# Patient Record
Sex: Male | Born: 1985 | Race: White | Hispanic: No | Marital: Married | Smoking: Never smoker
Health system: Southern US, Community
[De-identification: ages and names within clinical notes are randomized; demographics above are authoritative.]

## PROBLEM LIST (undated history)

## (undated) DIAGNOSIS — C801 Malignant (primary) neoplasm, unspecified: Secondary | ICD-10-CM

## (undated) DIAGNOSIS — I10 Essential (primary) hypertension: Secondary | ICD-10-CM

## (undated) DIAGNOSIS — C6290 Malignant neoplasm of unspecified testis, unspecified whether descended or undescended: Secondary | ICD-10-CM

## (undated) HISTORY — PX: ANTERIOR CRUCIATE LIGAMENT REPAIR: SHX115

## (undated) HISTORY — PX: ORCHIECTOMY: SHX2116

---

## 2019-05-15 DIAGNOSIS — S61401A Unspecified open wound of right hand, initial encounter: Secondary | ICD-10-CM | POA: Diagnosis not present

## 2019-11-18 DIAGNOSIS — Z20822 Contact with and (suspected) exposure to covid-19: Secondary | ICD-10-CM | POA: Diagnosis not present

## 2019-11-18 DIAGNOSIS — R05 Cough: Secondary | ICD-10-CM | POA: Diagnosis not present

## 2019-11-18 DIAGNOSIS — R110 Nausea: Secondary | ICD-10-CM | POA: Diagnosis not present

## 2019-12-02 DIAGNOSIS — H6692 Otitis media, unspecified, left ear: Secondary | ICD-10-CM | POA: Diagnosis not present

## 2019-12-02 DIAGNOSIS — R030 Elevated blood-pressure reading, without diagnosis of hypertension: Secondary | ICD-10-CM | POA: Diagnosis not present

## 2019-12-02 DIAGNOSIS — H6980 Other specified disorders of Eustachian tube, unspecified ear: Secondary | ICD-10-CM | POA: Diagnosis not present

## 2020-01-27 DIAGNOSIS — R110 Nausea: Secondary | ICD-10-CM | POA: Diagnosis not present

## 2020-01-27 DIAGNOSIS — B349 Viral infection, unspecified: Secondary | ICD-10-CM | POA: Diagnosis not present

## 2020-01-28 DIAGNOSIS — F4323 Adjustment disorder with mixed anxiety and depressed mood: Secondary | ICD-10-CM | POA: Diagnosis not present

## 2020-02-23 DIAGNOSIS — F4323 Adjustment disorder with mixed anxiety and depressed mood: Secondary | ICD-10-CM | POA: Diagnosis not present

## 2020-03-10 DIAGNOSIS — R059 Cough, unspecified: Secondary | ICD-10-CM | POA: Diagnosis not present

## 2020-03-10 DIAGNOSIS — Z20822 Contact with and (suspected) exposure to covid-19: Secondary | ICD-10-CM | POA: Diagnosis not present

## 2020-03-24 DIAGNOSIS — F4323 Adjustment disorder with mixed anxiety and depressed mood: Secondary | ICD-10-CM | POA: Diagnosis not present

## 2020-05-06 ENCOUNTER — Encounter (HOSPITAL_COMMUNITY): Payer: Self-pay

## 2020-05-06 ENCOUNTER — Emergency Department (HOSPITAL_COMMUNITY)
Admission: EM | Admit: 2020-05-06 | Discharge: 2020-05-06 | Disposition: A | Discharge status: Home / Self Care | Payer: BC Managed Care – PPO | Attending: Emergency Medicine | Admitting: Emergency Medicine

## 2020-05-06 ENCOUNTER — Other Ambulatory Visit: Payer: Self-pay

## 2020-05-06 DIAGNOSIS — R5383 Other fatigue: Secondary | ICD-10-CM | POA: Insufficient documentation

## 2020-05-06 DIAGNOSIS — Z5321 Procedure and treatment not carried out due to patient leaving prior to being seen by health care provider: Secondary | ICD-10-CM | POA: Insufficient documentation

## 2020-05-06 DIAGNOSIS — R112 Nausea with vomiting, unspecified: Secondary | ICD-10-CM | POA: Insufficient documentation

## 2020-05-06 HISTORY — DX: Malignant (primary) neoplasm, unspecified: C80.1

## 2020-05-06 HISTORY — DX: Malignant neoplasm of unspecified testis, unspecified whether descended or undescended: C62.90

## 2020-05-06 LAB — CBC
HCT: 47.8 % (ref 39.0–52.0)
Hemoglobin: 17.1 g/dL — ABNORMAL HIGH (ref 13.0–17.0)
MCH: 33.7 pg (ref 26.0–34.0)
MCHC: 35.8 g/dL (ref 30.0–36.0)
MCV: 94.1 fL (ref 80.0–100.0)
Platelets: 221 10*3/uL (ref 150–400)
RBC: 5.08 MIL/uL (ref 4.22–5.81)
RDW: 11.5 % (ref 11.5–15.5)
WBC: 6.6 10*3/uL (ref 4.0–10.5)
nRBC: 0 % (ref 0.0–0.2)

## 2020-05-06 LAB — COMPREHENSIVE METABOLIC PANEL
ALT: 235 U/L — ABNORMAL HIGH (ref 0–44)
AST: 700 U/L — ABNORMAL HIGH (ref 15–41)
Albumin: 4 g/dL (ref 3.5–5.0)
Alkaline Phosphatase: 126 U/L (ref 38–126)
Anion gap: 18 — ABNORMAL HIGH (ref 5–15)
BUN: 17 mg/dL (ref 6–20)
CO2: 22 mmol/L (ref 22–32)
Calcium: 8.4 mg/dL — ABNORMAL LOW (ref 8.9–10.3)
Chloride: 94 mmol/L — ABNORMAL LOW (ref 98–111)
Creatinine, Ser: 0.9 mg/dL (ref 0.61–1.24)
GFR, Estimated: >60 mL/min (ref >60)
Glucose, Bld: 107 mg/dL — ABNORMAL HIGH (ref 70–99)
Potassium: 3.6 mmol/L (ref 3.5–5.1)
Sodium: 134 mmol/L — ABNORMAL LOW (ref 135–145)
Total Bilirubin: 2.6 mg/dL — ABNORMAL HIGH (ref 0.3–1.2)
Total Protein: 7 g/dL (ref 6.5–8.1)

## 2020-05-06 LAB — LIPASE, BLOOD: Lipase: 107 U/L — ABNORMAL HIGH (ref 11–51)

## 2020-05-06 NOTE — ED Triage Notes (Signed)
Pt BIB EMS from home. Pt reports nausea, vomiting, and fatigue x3 days. Pt denies being vaccinated for COVID.  20G LH Zofran 4 mg

## 2020-05-06 NOTE — ED Notes (Signed)
Pt advised he is leaving and needs his IV removed. IV removed and triage RN notified.

## 2020-05-26 DIAGNOSIS — M25872 Other specified joint disorders, left ankle and foot: Secondary | ICD-10-CM | POA: Diagnosis not present

## 2020-05-26 DIAGNOSIS — R17 Unspecified jaundice: Secondary | ICD-10-CM | POA: Diagnosis not present

## 2020-05-26 DIAGNOSIS — B182 Chronic viral hepatitis C: Secondary | ICD-10-CM | POA: Diagnosis not present

## 2020-05-26 DIAGNOSIS — R000 Tachycardia, unspecified: Secondary | ICD-10-CM | POA: Diagnosis not present

## 2021-01-25 ENCOUNTER — Encounter (HOSPITAL_BASED_OUTPATIENT_CLINIC_OR_DEPARTMENT_OTHER): Payer: Self-pay

## 2021-01-25 ENCOUNTER — Emergency Department (HOSPITAL_BASED_OUTPATIENT_CLINIC_OR_DEPARTMENT_OTHER)
Admission: EM | Admit: 2021-01-25 | Discharge: 2021-01-26 | Disposition: A | Discharge status: Home / Self Care | Payer: BC Managed Care – PPO | Attending: Emergency Medicine | Admitting: Emergency Medicine

## 2021-01-25 ENCOUNTER — Other Ambulatory Visit: Payer: Self-pay

## 2021-01-25 DIAGNOSIS — M25521 Pain in right elbow: Secondary | ICD-10-CM | POA: Diagnosis present

## 2021-01-25 DIAGNOSIS — X58XXXA Exposure to other specified factors, initial encounter: Secondary | ICD-10-CM | POA: Insufficient documentation

## 2021-01-25 DIAGNOSIS — Z8547 Personal history of malignant neoplasm of testis: Secondary | ICD-10-CM | POA: Diagnosis not present

## 2021-01-25 DIAGNOSIS — M7021 Olecranon bursitis, right elbow: Secondary | ICD-10-CM | POA: Insufficient documentation

## 2021-01-25 DIAGNOSIS — Y9389 Activity, other specified: Secondary | ICD-10-CM | POA: Insufficient documentation

## 2021-01-25 NOTE — ED Triage Notes (Signed)
Patient arrives from home with c/o right elbow pain. There is large knot located on the right elbow that is warm to touch. Knot has been there for 2 weeks.

## 2021-01-26 ENCOUNTER — Emergency Department (HOSPITAL_BASED_OUTPATIENT_CLINIC_OR_DEPARTMENT_OTHER): Payer: BC Managed Care – PPO | Admitting: Radiology

## 2021-01-26 ENCOUNTER — Encounter (HOSPITAL_BASED_OUTPATIENT_CLINIC_OR_DEPARTMENT_OTHER): Payer: Self-pay | Admitting: Emergency Medicine

## 2021-01-26 MED ORDER — DOXYCYCLINE HYCLATE 100 MG PO CAPS: 100.0000 mg | ORAL_CAPSULE | Freq: Two times a day (BID) | ORAL | 0 refills | Status: AC

## 2021-01-26 MED ORDER — DOXYCYCLINE HYCLATE 100 MG PO TABS
100.0000 mg | ORAL_TABLET | Freq: Once | ORAL | Status: AC
  Administered 2021-01-26: 100 mg via ORAL
  Filled 2021-01-26: qty 1

## 2021-01-26 MED ORDER — MELOXICAM 15 MG PO TABS: 15.0000 mg | ORAL_TABLET | Freq: Every day | ORAL | 0 refills | Status: AC

## 2021-01-26 MED ORDER — KETOROLAC TROMETHAMINE 60 MG/2ML IM SOLN
30.0000 mg | Freq: Once | INTRAMUSCULAR | Status: AC
  Administered 2021-01-26: 30 mg via INTRAMUSCULAR
  Filled 2021-01-26: qty 2

## 2021-01-26 NOTE — ED Provider Notes (Signed)
[facility] EMERGENCY DEPT Provider Note   CSN: [id number] Arrival date & time: 01/25/21  2345     History Chief Complaint  Patient presents with   Elbow Pain    Right    Kyle Bell is a 35 y.o. male.  The history is provided by the patient.  Extremity Pain This is a new problem. The current episode started more than 1 week ago (2 weeks). The problem occurs constantly. The problem has not changed since onset.Pertinent negatives include no chest pain, no abdominal pain, no headaches and no shortness of breath. Nothing aggravates the symptoms. Nothing relieves the symptoms. Treatments tried: ibuprofen. The treatment provided no relief.  Patient with pain and swelling over the right olecranon for 2 weeks.  Does not do repetitive activity.  No trauma.  No f/c/r     Past Medical History:  Diagnosis Date   Cancer [facility])    Testicular cancer ([place])     There are no problems to display for this patient.   History reviewed. No pertinent surgical history.     History reviewed. No pertinent family history.     Home Medications Prior to Admission medications   Medication Sig Start Date End Date Taking? Authorizing Provider  doxycycline (VIBRAMYCIN) 100 MG capsule Take 1 capsule (100 mg total) by mouth 2 (two) times daily. One po bid x 7 days 01/26/21  Yes Cipriano Millikan, MD  meloxicam (MOBIC) 15 MG tablet Take 1 tablet (15 mg total) by mouth daily. 01/26/21  Yes Handsome Anglin, MD    Allergies    Patient has no known allergies.  Review of Systems   Review of Systems  Constitutional:  Negative for fever.  HENT:  Negative for drooling.   Eyes:  Negative for redness.  Respiratory:  Negative for shortness of breath.   Cardiovascular:  Negative for chest pain.  Gastrointestinal:  Negative for abdominal pain.  Genitourinary:  Negative for difficulty urinating.  Musculoskeletal:  Positive for arthralgias.  Neurological:  Negative for headaches.   Psychiatric/Behavioral:  Negative for agitation.   All other systems reviewed and are negative.  Physical Exam Updated Vital Signs BP (!) 154/108 (BP Location: Left Arm)   Pulse (!) 105   Temp 98.2 F (36.8 C) (Oral)   Resp 18   Ht '6\' 3"'$  (1.905 m)   Wt (!) 140.6 kg   SpO2 100%   BMI 38.75 kg/m   Physical Exam Vitals and nursing note reviewed.  Constitutional:      General: He is not in acute distress.    Appearance: Normal appearance.  HENT:     Head: Normocephalic and atraumatic.     Nose: Nose normal.  Eyes:     Conjunctiva/sclera: Conjunctivae normal.     Pupils: Pupils are equal, round, and reactive to light.  Cardiovascular:     Rate and Rhythm: Normal rate and regular rhythm.     Pulses: Normal pulses.     Heart sounds: Normal heart sounds.  Pulmonary:     Effort: Pulmonary effort is normal.     Breath sounds: Normal breath sounds.  Abdominal:     General: Abdomen is flat. Bowel sounds are normal.     Palpations: Abdomen is soft.     Tenderness: There is no abdominal tenderness. There is no guarding.  Musculoskeletal:     Right elbow: Swelling present. No deformity or lacerations. Normal range of motion. No tenderness.     Cervical back: Normal range of motion and neck supple.  Comments: Swelling of the R olecranon bursa.  Skin is mildy pink over the elbow.    Skin:    Capillary Refill: Capillary refill takes less than 2 seconds.  Neurological:     General: No focal deficit present.     Mental Status: He is alert and oriented to person, place, and time.     Deep Tendon Reflexes: Reflexes normal.  Psychiatric:        Mood and Affect: Mood normal.        Behavior: Behavior normal.    ED Results / Procedures / Treatments   Labs (all labs ordered are listed, but only abnormal results are displayed) Labs Reviewed - No data to display  EKG None  Radiology No results found.  Procedures Procedures   Medications Ordered in ED Medications   ketorolac (TORADOL) injection 30 mg (30 mg Intramuscular Given 01/26/21 0006)  doxycycline (VIBRA-TABS) tablet 100 mg (100 mg Oral Given 01/26/21 0006)    ED Course  I have reviewed the triage vital signs and the nursing notes.  Pertinent labs & imaging results that were available during my care of the patient were reviewed by me and considered in my medical decision making (see chart for details).    Clearly olecranon bursitis but Given the pink color of the skin will not tap the joint in the ED.  Will start high dose NSAIDs and doxycycline and refer to orthopedics for definitive care.    Kyle Bell was evaluated in Emergency Department on 01/26/2021 for the symptoms described in the history of present illness. He was evaluated in the context of the global COVID-19 pandemic, which necessitated consideration that the patient might be at risk for infection with the SARS-CoV-2 virus that causes COVID-19. Institutional protocols and algorithms that pertain to the evaluation of patients at risk for COVID-19 are in a state of rapid change based on information released by regulatory bodies including the CDC and federal and state organizations. These policies and algorithms were followed during the patient's care in the ED.  Final Clinical Impression(s) / ED Diagnoses Final diagnoses:  Olecranon bursitis of right elbow       Return for intractable cough, coughing up blood, fevers > 100.4 unrelieved by medication, shortness of breath, intractable vomiting, chest pain, shortness of breath, weakness, numbness, changes in speech, facial asymmetry, abdominal pain, passing out, Inability to tolerate liquids or food, cough, altered mental status or any concerns. No signs of systemic illness or infection. The patient is nontoxic-appearing on exam and vital signs are within normal limits. I have reviewed the triage vital signs and the nursing notes. Pertinent labs & imaging results that were available  during my care of the patient were reviewed by me and considered in my medical decision making (see chart for details). After history, exam, and medical workup I feel the patient has been appropriately medically screened and is safe for discharge home. Pertinent diagnoses were discussed with the patient. Patient was given return precautions. Rx / DC Orders ED Discharge Orders          Ordered    doxycycline (VIBRAMYCIN) 100 MG capsule  2 times daily        01/26/21 0002    meloxicam (MOBIC) 15 MG tablet  Daily        01/26/21 0002             Forrest Jaroszewski, MD 01/26/21 0017

## 2021-04-21 ENCOUNTER — Emergency Department (HOSPITAL_BASED_OUTPATIENT_CLINIC_OR_DEPARTMENT_OTHER)
Admission: EM | Admit: 2021-04-21 | Discharge: 2021-04-21 | Disposition: A | Discharge status: Home / Self Care | Payer: BC Managed Care – PPO | Attending: Emergency Medicine | Admitting: Emergency Medicine

## 2021-04-21 ENCOUNTER — Encounter (HOSPITAL_BASED_OUTPATIENT_CLINIC_OR_DEPARTMENT_OTHER): Payer: Self-pay | Admitting: Emergency Medicine

## 2021-04-21 ENCOUNTER — Other Ambulatory Visit (HOSPITAL_BASED_OUTPATIENT_CLINIC_OR_DEPARTMENT_OTHER): Payer: Self-pay

## 2021-04-21 ENCOUNTER — Other Ambulatory Visit: Payer: Self-pay

## 2021-04-21 DIAGNOSIS — H66005 Acute suppurative otitis media without spontaneous rupture of ear drum, recurrent, left ear: Secondary | ICD-10-CM | POA: Diagnosis not present

## 2021-04-21 DIAGNOSIS — Z8549 Personal history of malignant neoplasm of other male genital organs: Secondary | ICD-10-CM | POA: Diagnosis not present

## 2021-04-21 DIAGNOSIS — H9202 Otalgia, left ear: Secondary | ICD-10-CM | POA: Diagnosis present

## 2021-04-21 DIAGNOSIS — F17220 Nicotine dependence, chewing tobacco, uncomplicated: Secondary | ICD-10-CM | POA: Diagnosis not present

## 2021-04-21 MED ORDER — AMOXICILLIN-POT CLAVULANATE 875-125 MG PO TABS
1.0000 | ORAL_TABLET | Freq: Two times a day (BID) | ORAL | 0 refills | Status: DC
  Filled 2021-04-21: qty 14, 7d supply, fill #0

## 2021-04-21 NOTE — ED Notes (Signed)
Pt discharged home after verbalizing understanding of discharge instructions; nad noted. 

## 2021-04-21 NOTE — Discharge Instructions (Signed)
1.  Start taking Augmentin twice daily as prescribed 2.  You may take ibuprofen per package instructions and extra strength Tylenol every 6 hours for pain control. 3.  You should have a recheck with your doctor for resolution of your infection.  If you are getting recurrent ear infections again, you should have referral to an ear nose throat specialist.

## 2021-04-21 NOTE — ED Provider Notes (Signed)
[place] EMERGENCY DEPT Provider Note   CSN: [id number] Arrival date & time: 04/21/21  0801     History Chief Complaint  Patient presents with   Otalgia    Kyle Bell is a 35 y.o. male.  HPI Patient report gradual onset of left ear pain.  Aching and burning quality.  Prior history of ear infections.  Last ear infection about 2 years ago.  As a child had ear tubes.  He had Ciprodex from a leftover prescription from family member.  He has tried this for several days with no improvement.  Patient reports associated symptoms are nasal congestion and sinus drainage.  He denies any facial pain or pressure.  No fevers no chills no neck pain or stiffness.    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   History reviewed. No pertinent surgical history.     History reviewed. No pertinent family history.  Social History   Tobacco Use   Smoking status: Never   Smokeless tobacco: Current    Types: Snuff  Vaping Use   Vaping Use: Never used  Substance Use Topics   Alcohol use: Yes    Comment: rarely   Drug use: Not Currently    Home Medications Prior to Admission medications   Medication Sig Start Date End Date Taking? Authorizing Provider  amoxicillin-clavulanate (AUGMENTIN) 875-125 MG tablet Take 1 tablet by mouth 2 (two) times daily. One po bid x 7 days 04/21/21  Yes Keita Demarco, Jeannie Done, MD  doxycycline (VIBRAMYCIN) 100 MG capsule Take 1 capsule (100 mg total) by mouth 2 (two) times daily. One po bid x 7 days 01/26/21   Palumbo, April, MD  meloxicam (MOBIC) 15 MG tablet Take 1 tablet (15 mg total) by mouth daily. 01/26/21   Palumbo, April, MD    Allergies    Patient has no known allergies.  Review of Systems   Review of Systems Constitutional: No fever no chills no malaise Respiratory: No cough no shortness of breath no chest pain. GI: No nausea vomiting  or diarrhea. Physical Exam Updated Vital Signs BP (!) 156/103 (BP Location: Right Arm)   Pulse 85   Temp 98.7 F (37.1 C) (Oral)   Resp 20   Ht 6\' 3"  (1.905 m)   Wt 131.5 kg   SpO2 100%   BMI 36.25 kg/m   Physical Exam Constitutional:      Comments: Alert nontoxic clinically well in appearance.  HENT:     Ears:     Comments: Normal external inspection of both ears.  No tenderness to movement of the pinna.  Left ear canal is normal, TM is retracted erythematous and irregular.  Right TM slightly retracted but normal in appearance without erythema or effusion.    Nose: Nose normal.     Mouth/Throat:     Mouth: Mucous membranes are moist.     Pharynx: Oropharynx is clear.  Eyes:     Extraocular Movements: Extraocular movements intact.     Conjunctiva/sclera: Conjunctivae normal.     Pupils: Pupils are equal, round, and reactive to light.  Cardiovascular:     Rate and Rhythm: Normal rate and regular rhythm.  Pulmonary:     Breath sounds: Normal breath sounds.  Musculoskeletal:     Cervical back: Neck supple.  Skin:    General: Skin is warm and dry.  Neurological:  General: No focal deficit present.     Mental Status: He is oriented to person, place, and time.     Coordination: Coordination normal.  Psychiatric:        Mood and Affect: Mood normal.    ED Results / Procedures / Treatments   Labs (all labs ordered are listed, but only abnormal results are displayed) Labs Reviewed - No data to display  EKG None  Radiology No results found.  Procedures Procedures   Medications Ordered in ED Medications - No data to display  ED Course  I have reviewed the triage vital signs and the nursing notes.  Pertinent labs & imaging results that were available during my care of the patient were reviewed by me and considered in my medical decision making (see chart for details).    MDM Rules/Calculators/A&P                           Patient is alert and nontoxic.   Findings are consistent with otitis media.  Patient has had prior history of extensive ear infections in childhood.  He also has periodically as an adult been susceptible to ear infections.  Last episode 2 years ago.  Patient reports sinus congestion but denies any facial pain, eye pain or headache.  No fevers or neck stiffness.  No signs of meningismus.  No signs of otitis externa.  Patient has been trying Ciprodex without improvement.  At this time will prescribe Augmentin.  We reviewed pain control with combination of ibuprofen and Tylenol.  Patient should have follow-up with PCP and referral to ENT if recurrent persisting otitis media. Final Clinical Impression(s) / ED Diagnoses Final diagnoses:  Recurrent acute suppurative otitis media without spontaneous rupture of left tympanic membrane    Rx / DC Orders ED Discharge Orders          Ordered    amoxicillin-clavulanate (AUGMENTIN) 875-125 MG tablet  2 times daily        04/21/21 7858             Charlesetta Shanks, MD 04/21/21 [phone]

## 2021-04-21 NOTE — ED Triage Notes (Signed)
Pt via pov from home with earache x 3 days. He has been using ear drops with no improvement. Denies fevers, endorses cough and nasal congestion. Pt has hx of ear infections. Pt alert & oriented, nad noted.

## 2021-08-02 ENCOUNTER — Other Ambulatory Visit: Payer: Self-pay

## 2021-08-02 ENCOUNTER — Encounter (HOSPITAL_BASED_OUTPATIENT_CLINIC_OR_DEPARTMENT_OTHER): Payer: Self-pay | Admitting: Emergency Medicine

## 2021-08-02 ENCOUNTER — Emergency Department (HOSPITAL_BASED_OUTPATIENT_CLINIC_OR_DEPARTMENT_OTHER): Payer: BC Managed Care – PPO | Admitting: Radiology

## 2021-08-02 ENCOUNTER — Emergency Department (HOSPITAL_BASED_OUTPATIENT_CLINIC_OR_DEPARTMENT_OTHER)
Admission: EM | Admit: 2021-08-02 | Discharge: 2021-08-02 | Disposition: A | Discharge status: Home / Self Care | Payer: BC Managed Care – PPO | Attending: Emergency Medicine | Admitting: Emergency Medicine

## 2021-08-02 DIAGNOSIS — J069 Acute upper respiratory infection, unspecified: Secondary | ICD-10-CM

## 2021-08-02 DIAGNOSIS — I10 Essential (primary) hypertension: Secondary | ICD-10-CM | POA: Diagnosis not present

## 2021-08-02 DIAGNOSIS — Z8547 Personal history of malignant neoplasm of testis: Secondary | ICD-10-CM | POA: Diagnosis not present

## 2021-08-02 DIAGNOSIS — Z20822 Contact with and (suspected) exposure to covid-19: Secondary | ICD-10-CM | POA: Diagnosis not present

## 2021-08-02 DIAGNOSIS — R509 Fever, unspecified: Secondary | ICD-10-CM | POA: Diagnosis present

## 2021-08-02 HISTORY — DX: Essential (primary) hypertension: I10

## 2021-08-02 LAB — CBC WITH DIFFERENTIAL/PLATELET
Abs Immature Granulocytes: 0.04 10*3/uL (ref 0.00–0.07)
Basophils Absolute: 0 10*3/uL (ref 0.0–0.1)
Basophils Relative: 0 %
Eosinophils Absolute: 0 10*3/uL (ref 0.0–0.5)
Eosinophils Relative: 0 %
HCT: 44.8 % (ref 39.0–52.0)
Hemoglobin: 15.5 g/dL (ref 13.0–17.0)
Immature Granulocytes: 0 %
Lymphocytes Relative: 7 %
Lymphs Abs: 0.7 10*3/uL (ref 0.7–4.0)
MCH: 31.6 pg (ref 26.0–34.0)
MCHC: 34.6 g/dL (ref 30.0–36.0)
MCV: 91.2 fL (ref 80.0–100.0)
Monocytes Absolute: 0.9 10*3/uL (ref 0.1–1.0)
Monocytes Relative: 9 %
Neutro Abs: 7.6 10*3/uL (ref 1.7–7.7)
Neutrophils Relative %: 84 %
Platelets: 171 10*3/uL (ref 150–400)
RBC: 4.91 MIL/uL (ref 4.22–5.81)
RDW: 12.7 % (ref 11.5–15.5)
WBC: 9.2 10*3/uL (ref 4.0–10.5)
nRBC: 0 % (ref 0.0–0.2)

## 2021-08-02 LAB — COMPREHENSIVE METABOLIC PANEL
ALT: 113 U/L — ABNORMAL HIGH (ref 0–44)
AST: 187 U/L — ABNORMAL HIGH (ref 15–41)
Albumin: 4.6 g/dL (ref 3.5–5.0)
Alkaline Phosphatase: 59 U/L (ref 38–126)
Anion gap: 13 (ref 5–15)
BUN: 13 mg/dL (ref 6–20)
CO2: 24 mmol/L (ref 22–32)
Calcium: 9.3 mg/dL (ref 8.9–10.3)
Chloride: 98 mmol/L (ref 98–111)
Creatinine, Ser: 0.94 mg/dL (ref 0.61–1.24)
GFR, Estimated: >60 mL/min (ref >60)
Glucose, Bld: 119 mg/dL — ABNORMAL HIGH (ref 70–99)
Potassium: 3.3 mmol/L — ABNORMAL LOW (ref 3.5–5.1)
Sodium: 135 mmol/L (ref 135–145)
Total Bilirubin: 1.1 mg/dL (ref 0.3–1.2)
Total Protein: 7.2 g/dL (ref 6.5–8.1)

## 2021-08-02 LAB — URINALYSIS, ROUTINE W REFLEX MICROSCOPIC
Bilirubin Urine: NEGATIVE
Glucose, UA: NEGATIVE mg/dL
Hgb urine dipstick: NEGATIVE
Leukocytes,Ua: NEGATIVE
Nitrite: NEGATIVE
Protein, ur: 30 mg/dL — AB
Specific Gravity, Urine: 1.016 (ref 1.005–1.030)
pH: 7 (ref 5.0–8.0)

## 2021-08-02 LAB — GROUP A STREP BY PCR: Group A Strep by PCR: NOT DETECTED

## 2021-08-02 LAB — RESP PANEL BY RT-PCR (FLU A&B, COVID) ARPGX2
Influenza A by PCR: NEGATIVE
Influenza B by PCR: NEGATIVE
SARS Coronavirus 2 by RT PCR: NEGATIVE

## 2021-08-02 LAB — LACTIC ACID, PLASMA: Lactic Acid, Venous: 1.8 mmol/L (ref 0.5–1.9)

## 2021-08-02 MED ORDER — IBUPROFEN 400 MG PO TABS
600.0000 mg | ORAL_TABLET | Freq: Once | ORAL | Status: AC
  Administered 2021-08-02: 600 mg via ORAL
  Filled 2021-08-02: qty 1

## 2021-08-02 MED ORDER — POTASSIUM CHLORIDE CRYS ER 20 MEQ PO TBCR
40.0000 meq | EXTENDED_RELEASE_TABLET | Freq: Once | ORAL | Status: AC
  Administered 2021-08-02: 40 meq via ORAL
  Filled 2021-08-02: qty 2

## 2021-08-02 NOTE — Discharge Instructions (Addendum)

## 2021-08-02 NOTE — ED Triage Notes (Signed)
Pt states he had an energy drink this morning (heart rate 136).

## 2021-08-02 NOTE — ED Triage Notes (Signed)
Pt awoke today with fever/chills. Fever 101 orally, pt with aches all over/tired. Denies any congestion. Son was sick with this last week and is better.

## 2021-08-02 NOTE — ED Provider Notes (Signed)
[place] EMERGENCY DEPT Provider Note   CSN: [id number] Arrival date & time: 08/02/21  1659     History  Chief Complaint  Patient presents with   Fever    Kyle Bell is a 36 y.o. male.  HPI   Pt is a 36 y/o male with a h/o testicular ca, htn, who presents to the ed today for eval of fever, sore throat, headache and a mild cough that started earlier today. He reports urinary frequency. He denies chest pain, sob, vomiting, diarrhea, dysuria, abd pain.  He states that his 37 year old was sick with similar sxs all last week   Home Medications Prior to Admission medications   Medication Sig Start Date End Date Taking? Authorizing Provider  amoxicillin-clavulanate (AUGMENTIN) 875-125 MG tablet Take 1 tablet by mouth 2 (two) times daily. One po bid x 7 days 04/21/21   Charlesetta Shanks, MD  doxycycline (VIBRAMYCIN) 100 MG capsule Take 1 capsule (100 mg total) by mouth 2 (two) times daily. One po bid x 7 days 01/26/21   Palumbo, April, MD  meloxicam (MOBIC) 15 MG tablet Take 1 tablet (15 mg total) by mouth daily. 01/26/21   Palumbo, April, MD      Allergies    Patient has no known allergies.    Review of Systems   Review of Systems See HPI for pertinent positives or negatives.   Physical Exam Updated Vital Signs BP (!) 151/102 (BP Location: Right Arm)    Pulse (!) 126    Temp 98.2 F (36.8 C)    Resp 18    SpO2 98%  Physical Exam Vitals and nursing note reviewed.  Constitutional:      General: He is not in acute distress.    Appearance: He is well-developed.  HENT:     Head: Normocephalic and atraumatic.     Mouth/Throat:     Pharynx: Uvula midline. Posterior oropharyngeal erythema present. No oropharyngeal exudate or uvula swelling.     Tonsils: No tonsillar exudate or tonsillar abscesses. 1+ on the right. 1+ on the left.  Eyes:     Conjunctiva/sclera: Conjunctivae normal.  Cardiovascular:     Rate and Rhythm: Regular rhythm. Tachycardia present.      Heart sounds: No murmur heard. Pulmonary:     Effort: Pulmonary effort is normal. No respiratory distress.     Breath sounds: Normal breath sounds.  Abdominal:     General: Bowel sounds are normal.     Palpations: Abdomen is soft.     Tenderness: There is no abdominal tenderness. There is no guarding or rebound.  Musculoskeletal:        General: No swelling.     Cervical back: Neck supple.  Skin:    General: Skin is warm and dry.     Capillary Refill: Capillary refill takes less than 2 seconds.  Neurological:     Mental Status: He is alert.  Psychiatric:        Mood and Affect: Mood normal.    ED Results / Procedures / Treatments   Labs (all labs ordered are listed, but only abnormal results are displayed) Labs Reviewed  COMPREHENSIVE METABOLIC PANEL - Abnormal; Notable for the following components:      Result Value   Potassium 3.3 (*)    Glucose, Bld 119 (*)    AST 187 (*)    ALT 113 (*)    All other components within normal limits  URINALYSIS, ROUTINE W REFLEX MICROSCOPIC - Abnormal; Notable for  the following components:   Ketones, ur TRACE (*)    Protein, ur 30 (*)    All other components within normal limits  RESP PANEL BY RT-PCR (FLU A&B, COVID) [id number]  GROUP A STREP BY PCR  LACTIC ACID, PLASMA  CBC WITH DIFFERENTIAL/PLATELET  LACTIC ACID, PLASMA    EKG None  Radiology DG Chest 2 View  Result Date: 08/02/2021 CLINICAL DATA:  Fever EXAM: CHEST - 2 VIEW COMPARISON:  None. FINDINGS: The heart size and mediastinal contours are within normal limits. Both lungs are clear. The visualized skeletal structures are unremarkable. IMPRESSION: No active cardiopulmonary disease. Electronically Signed   By: Franchot Gallo M.D.   On: 08/02/2021 17:43    Procedures Procedures    Medications Ordered in ED Medications  ibuprofen (ADVIL) tablet 600 mg (600 mg Oral Given 08/02/21 1835)  potassium chloride SA (KLOR-CON M) CR tablet 40 mEq (40 mEq Oral Given 08/02/21 1835)     ED Course/ Medical Decision Making/ A&P                           Medical Decision Making Amount and/or Complexity of Data Reviewed Labs: ordered. Radiology: ordered.  Risk Prescription drug management.   This patient presents to the ED for concern of fever, sore throat, this involves an extensive number of treatment options, and is a complaint that carries with it a high risk of complications and morbidity.  The differential diagnosis includes but is not limited to uri, pna, strep throat, pta, rpa   Comorbidities that complicate the patient evaluation: Patients presentation is complicated by their history of obesity, htn, ca  Additional history obtained: Records reviewed Care Everywhere/External Records  Lab Tests: I Ordered, and personally interpreted labs.  The pertinent results include:   CBC wnl CMP with mild hypokalemia, elevated lfts which appears chronic Lactic neg UA neg for uti Covid/flu neg Strep neg  Imaging Studies ordered: I ordered, independently visualized, and interpreted imaging which showed   CXR negative  I agree with the radiologist interpretation  Cardiac Monitoring: The patient was maintained on a cardiac monitor.  I personally viewed and interpreted the cardiac monitor which showed an underlying rhythm of:  sinus tachycardia  Medicines ordered and prescription drug management: I ordered medication including ibuprofen, po fluids  for tachycardia  Reevaluation of the patient after these medicines showed that the patient    improved  Complexity of problems addressed: Patients presentation is most consistent with  acute complicated illness/injury requiring diagnostic workup  Disposition: After consideration of the diagnostic results and the patients response to treatment,  I feel that the patent would benefit from discharge home. Suspect viral upper respiratory infection. Doubt bacterial cause. No signs of sepsis. Advised to rotate tylenol  and motrin for pain at home. .  Discussed findings (including incidental findings of elevated lfts) and plan. Advised pcp f/u and return to the ED if no improvement or worsening sxs. Pt voices understanding of the plan and reasons to return. All questions answered. Pt stable for discharge.   Final Clinical Impression(s) / ED Diagnoses Final diagnoses:  Viral URI    Rx / DC Orders ED Discharge Orders     None         Rodney Booze, PA-C 08/02/21 1917    Lorelle Gibbs, DO 08/02/21 2119

## 2021-11-29 ENCOUNTER — Other Ambulatory Visit: Payer: Self-pay

## 2021-11-29 ENCOUNTER — Emergency Department (HOSPITAL_BASED_OUTPATIENT_CLINIC_OR_DEPARTMENT_OTHER): Payer: BC Managed Care – PPO | Admitting: Radiology

## 2021-11-29 ENCOUNTER — Other Ambulatory Visit (HOSPITAL_BASED_OUTPATIENT_CLINIC_OR_DEPARTMENT_OTHER): Payer: Self-pay

## 2021-11-29 ENCOUNTER — Encounter (HOSPITAL_BASED_OUTPATIENT_CLINIC_OR_DEPARTMENT_OTHER): Payer: Self-pay

## 2021-11-29 ENCOUNTER — Emergency Department (HOSPITAL_BASED_OUTPATIENT_CLINIC_OR_DEPARTMENT_OTHER)
Admission: EM | Admit: 2021-11-29 | Discharge: 2021-11-29 | Disposition: A | Discharge status: Home / Self Care | Payer: BC Managed Care – PPO | Attending: Emergency Medicine | Admitting: Emergency Medicine

## 2021-11-29 DIAGNOSIS — W540XXA Bitten by dog, initial encounter: Secondary | ICD-10-CM | POA: Diagnosis not present

## 2021-11-29 DIAGNOSIS — S61431A Puncture wound without foreign body of right hand, initial encounter: Secondary | ICD-10-CM | POA: Diagnosis not present

## 2021-11-29 DIAGNOSIS — S6991XA Unspecified injury of right wrist, hand and finger(s), initial encounter: Secondary | ICD-10-CM | POA: Diagnosis present

## 2021-11-29 MED ORDER — AMOXICILLIN-POT CLAVULANATE 875-125 MG PO TABS
1.0000 | ORAL_TABLET | Freq: Once | ORAL | Status: AC
  Administered 2021-11-29: 1 via ORAL
  Filled 2021-11-29: qty 1

## 2021-11-29 MED ORDER — AMOXICILLIN-POT CLAVULANATE 875-125 MG PO TABS
1.0000 | ORAL_TABLET | Freq: Two times a day (BID) | ORAL | 0 refills | Status: AC
  Filled 2021-11-29: qty 20, 10d supply, fill #0

## 2021-11-29 NOTE — ED Notes (Signed)
Hand wounds cleaned and soaked in NS with Betadine.  Zeroform applied with gauze drsg.

## 2021-11-29 NOTE — ED Provider Notes (Signed)
[facility] EMERGENCY DEPT Provider Note   CSN: [id number] Arrival date & time: 11/29/21  7829     History  Chief Complaint  Patient presents with   Animal Bite    Kyle Bell is a 36 y.o. male.  The history is provided by the patient and medical records. No language interpreter was used.  Animal Bite Contact animal:  Dog Location:  Hand Hand injury location:  Dorsum of R hand and R palm Time since incident:  11 hours Pain details:    Quality:  Aching   Severity:  Mild   Timing:  Constant   Progression:  Unchanged Incident location:  Home Provoked: provoked (accidently)   Animal's rabies vaccination status:  Up to date Animal in possession: yes   Tetanus status:  Up to date Relieved by:  Nothing Worsened by:  Nothing Ineffective treatments:  None tried Associated symptoms: swelling   Associated symptoms: no fever, no numbness and no rash        Home Medications Prior to Admission medications   Medication Sig Start Date End Date Taking? Authorizing Provider  amoxicillin-clavulanate (AUGMENTIN) 875-125 MG tablet Take 1 tablet by mouth 2 (two) times daily. One po bid x 7 days 04/21/21   Arby Barrette, MD  doxycycline (VIBRAMYCIN) 100 MG capsule Take 1 capsule (100 mg total) by mouth 2 (two) times daily. One po bid x 7 days 01/26/21   Palumbo, April, MD  meloxicam (MOBIC) 15 MG tablet Take 1 tablet (15 mg total) by mouth daily. 01/26/21   Palumbo, April, MD      Allergies    Ivp dye [iodinated contrast media]    Review of Systems   Review of Systems  Constitutional:  Negative for chills, fatigue and fever.  HENT:  Negative for congestion.   Respiratory:  Negative for cough and chest tightness.   Cardiovascular:  Negative for chest pain.  Gastrointestinal:  Negative for abdominal pain.  Musculoskeletal:  Negative for back pain.  Skin:  Positive for wound. Negative for rash.  Neurological:  Negative for numbness and headaches.   Psychiatric/Behavioral:  Negative for agitation.   All other systems reviewed and are negative.   Physical Exam Updated Vital Signs BP (!) 166/116 (BP Location: Right Arm)   Pulse (!) 105   Temp 98.3 F (36.8 C) (Oral)   Resp 16   Ht 6\' 2"  (1.88 m)   Wt 136.1 kg   BMI 38.52 kg/m  Physical Exam Vitals and nursing note reviewed.  Constitutional:      General: He is not in acute distress.    Appearance: He is well-developed.  HENT:     Head: Normocephalic and atraumatic.  Eyes:     Conjunctiva/sclera: Conjunctivae normal.  Cardiovascular:     Rate and Rhythm: Normal rate and regular rhythm.     Heart sounds: No murmur heard. Pulmonary:     Effort: Pulmonary effort is normal. No respiratory distress.     Breath sounds: Normal breath sounds.  Abdominal:     Palpations: Abdomen is soft.     Tenderness: There is no abdominal tenderness.  Musculoskeletal:        General: Swelling, tenderness and signs of injury present.     Right hand: Swelling, laceration and tenderness present. No deformity. Normal sensation. Normal capillary refill. Normal pulse.     Cervical back: Neck supple.     Right lower leg: No edema.     Left lower leg: No edema.  Comments: Patient has 2 puncture wounds to the right hand one on the dorsum near the base of the thumb and 1 on the palm.  Hemostatic.  Minimally tender.  Some swelling but no significant erythema.  No crepitance.  No drainage from the wound.  No other tenderness on the wrist.  Intact cap refill and movement of the fingers.  Skin:    General: Skin is warm and dry.     Capillary Refill: Capillary refill takes less than 2 seconds.     Findings: No erythema.  Neurological:     General: No focal deficit present.     Mental Status: He is alert.     Sensory: No sensory deficit.     Motor: No weakness.  Psychiatric:        Mood and Affect: Mood normal.     ED Results / Procedures / Treatments   Labs (all labs ordered are listed, but  only abnormal results are displayed) Labs Reviewed - No data to display  EKG None  Radiology DG Hand Complete Right  Result Date: 11/29/2021 CLINICAL DATA:  dog bite, rule out fx of foreign body. EXAM: RIGHT HAND - COMPLETE 3+ VIEW COMPARISON:  None Available. FINDINGS: There is no evidence of acute fracture. Alignment is normal. There is soft tissue swelling. No evidence of radiopaque foreign body. IMPRESSION: Right hand soft tissue swelling. No evidence of radiopaque foreign body. No acute osseous abnormality. Electronically Signed   By: Caprice Renshaw M.D.   On: 11/29/2021 07:48    Procedures Procedures    Medications Ordered in ED Medications  amoxicillin-clavulanate (AUGMENTIN) 875-125 MG per tablet 1 tablet (1 tablet Oral Given 11/29/21 2951)    ED Course/ Medical Decision Making/ A&P                           Medical Decision Making Amount and/or Complexity of Data Reviewed Radiology: ordered.  Risk Prescription drug management.    Enrike Politis is a 36 y.o. right-handed male with a past medical history significant for hypertension and testicular cancer who presents with dog bite.  According to patient, the p.m. last night, he was grabbing his elderly dog to put him in a kennel when the dog got startled and actually bit him in the right hand.  The dog is up-to-date on shots and is the patient's actual pet.  There was no reported malice and the dog simply got startled and scared and bit him briefly before letting go.  He has no other complaints and no preceding symptoms.  He sustained 2 bite wounds to his hand 1 on the dorsum near the base of the thumb and 1 on the palmar surface.  Both are very small and hemostatic.  Patient denies any fevers, chills, or other complaints.  No other injuries reported.  Patient reports his tetanus is up-to-date.  On exam, lungs clear and chest nontender.  Wrist nontender.  Good pulses.  Good sensation, strength, and cap refill.  Patient  has 2 puncture wounds to his hand on the right side 1 on the dorsum of the base of the thumb and 1 on the palmar surface.  Both are hemostatic and less than 1 cm in size.  No other injury seen on rest of exam.  As patient's tetanus is up-to-date, we will not update it however we will give him a dose of Augmentin.  Patient has tolerated his medication in the past per the chart.  We will get an x-ray to look for fracture or any foreign body or tooth fragments however I have low suspicion for this at this time.  If x-ray is reassuring, we will wash his wound, dress it, and he will take antibiotics and follow-up with his PCP.  As the patient's dog is his and is up-to-date with vaccinations, do not feel he needs any rabies vaccination at this time.   We will be able to monitor the dog.  Anticipate discharge after x-ray and wound management.   Wound was cleaned and dressed and well-appearing.  Patient given first dose of antibiotics and will be discharged.  Patient agrees and discharged in good condition.        Final Clinical Impression(s) / ED Diagnoses Final diagnoses:  Dog bite, initial encounter    Rx / DC Orders ED Discharge Orders          Ordered    amoxicillin-clavulanate (AUGMENTIN) 875-125 MG tablet  Every 12 hours        11/29/21 0810           Clinical Impression: 1. Dog bite, initial encounter     Disposition: Discharge  Condition: Good  I have discussed the results, Dx and Tx plan with the pt(& family if present). He/she/they expressed understanding and agree(s) with the plan. Discharge instructions discussed at great length. Strict return precautions discussed and pt &/or family have verbalized understanding of the instructions. No further questions at time of discharge.    Discharge Medication List as of 11/29/2021  8:10 AM     START taking these medications   Details  !! amoxicillin-clavulanate (AUGMENTIN) 875-125 MG tablet Take 1 tablet by mouth every 12  (twelve) hours for 10 days., Starting Tue 11/29/2021, Until Fri 12/09/2021, Print     !! - Potential duplicate medications found. Please discuss with provider.      Follow Up: Salli Real, MD [address] [place] [address] [phone]     [facility] [address] [place] [address] [phone]       Eryn Marandola, Canary Brim, MD 11/29/21 [phone]

## 2022-01-24 ENCOUNTER — Other Ambulatory Visit (HOSPITAL_BASED_OUTPATIENT_CLINIC_OR_DEPARTMENT_OTHER): Payer: Self-pay

## 2022-01-24 MED ORDER — OMEPRAZOLE 20 MG PO CPDR
20.0000 mg | DELAYED_RELEASE_CAPSULE | Freq: Every day | ORAL | 0 refills | Status: AC
  Filled 2022-01-24: qty 30, 30d supply, fill #0

## 2022-01-24 MED ORDER — ALLOPURINOL 300 MG PO TABS
300.0000 mg | ORAL_TABLET | Freq: Every day | ORAL | 0 refills | Status: AC
  Filled 2022-01-24: qty 30, 30d supply, fill #0

## 2022-01-24 MED ORDER — LOSARTAN POTASSIUM-HCTZ 100-25 MG PO TABS
1.0000 | ORAL_TABLET | Freq: Every day | ORAL | 5 refills | Status: DC
  Filled 2022-01-24 – 2022-02-22 (×2): qty 30, 30d supply, fill #0
  Filled 2022-03-26: qty 30, 30d supply, fill #1
  Filled 2022-05-05: qty 30, 30d supply, fill #2
  Filled 2022-06-01 (×2): qty 30, 30d supply, fill #3

## 2022-02-22 ENCOUNTER — Other Ambulatory Visit (HOSPITAL_BASED_OUTPATIENT_CLINIC_OR_DEPARTMENT_OTHER): Payer: Self-pay

## 2022-02-23 ENCOUNTER — Other Ambulatory Visit (HOSPITAL_BASED_OUTPATIENT_CLINIC_OR_DEPARTMENT_OTHER): Payer: Self-pay

## 2022-03-27 ENCOUNTER — Other Ambulatory Visit (HOSPITAL_BASED_OUTPATIENT_CLINIC_OR_DEPARTMENT_OTHER): Payer: Self-pay

## 2022-05-05 ENCOUNTER — Other Ambulatory Visit (HOSPITAL_BASED_OUTPATIENT_CLINIC_OR_DEPARTMENT_OTHER): Payer: Self-pay

## 2022-05-31 IMAGING — DX DG CHEST 2V
2 series · 2 of 2 positions shown · non-contrast
Comparison: None.

CLINICAL DATA: Fever

EXAM:
CHEST - 2 VIEW

[chest pa]
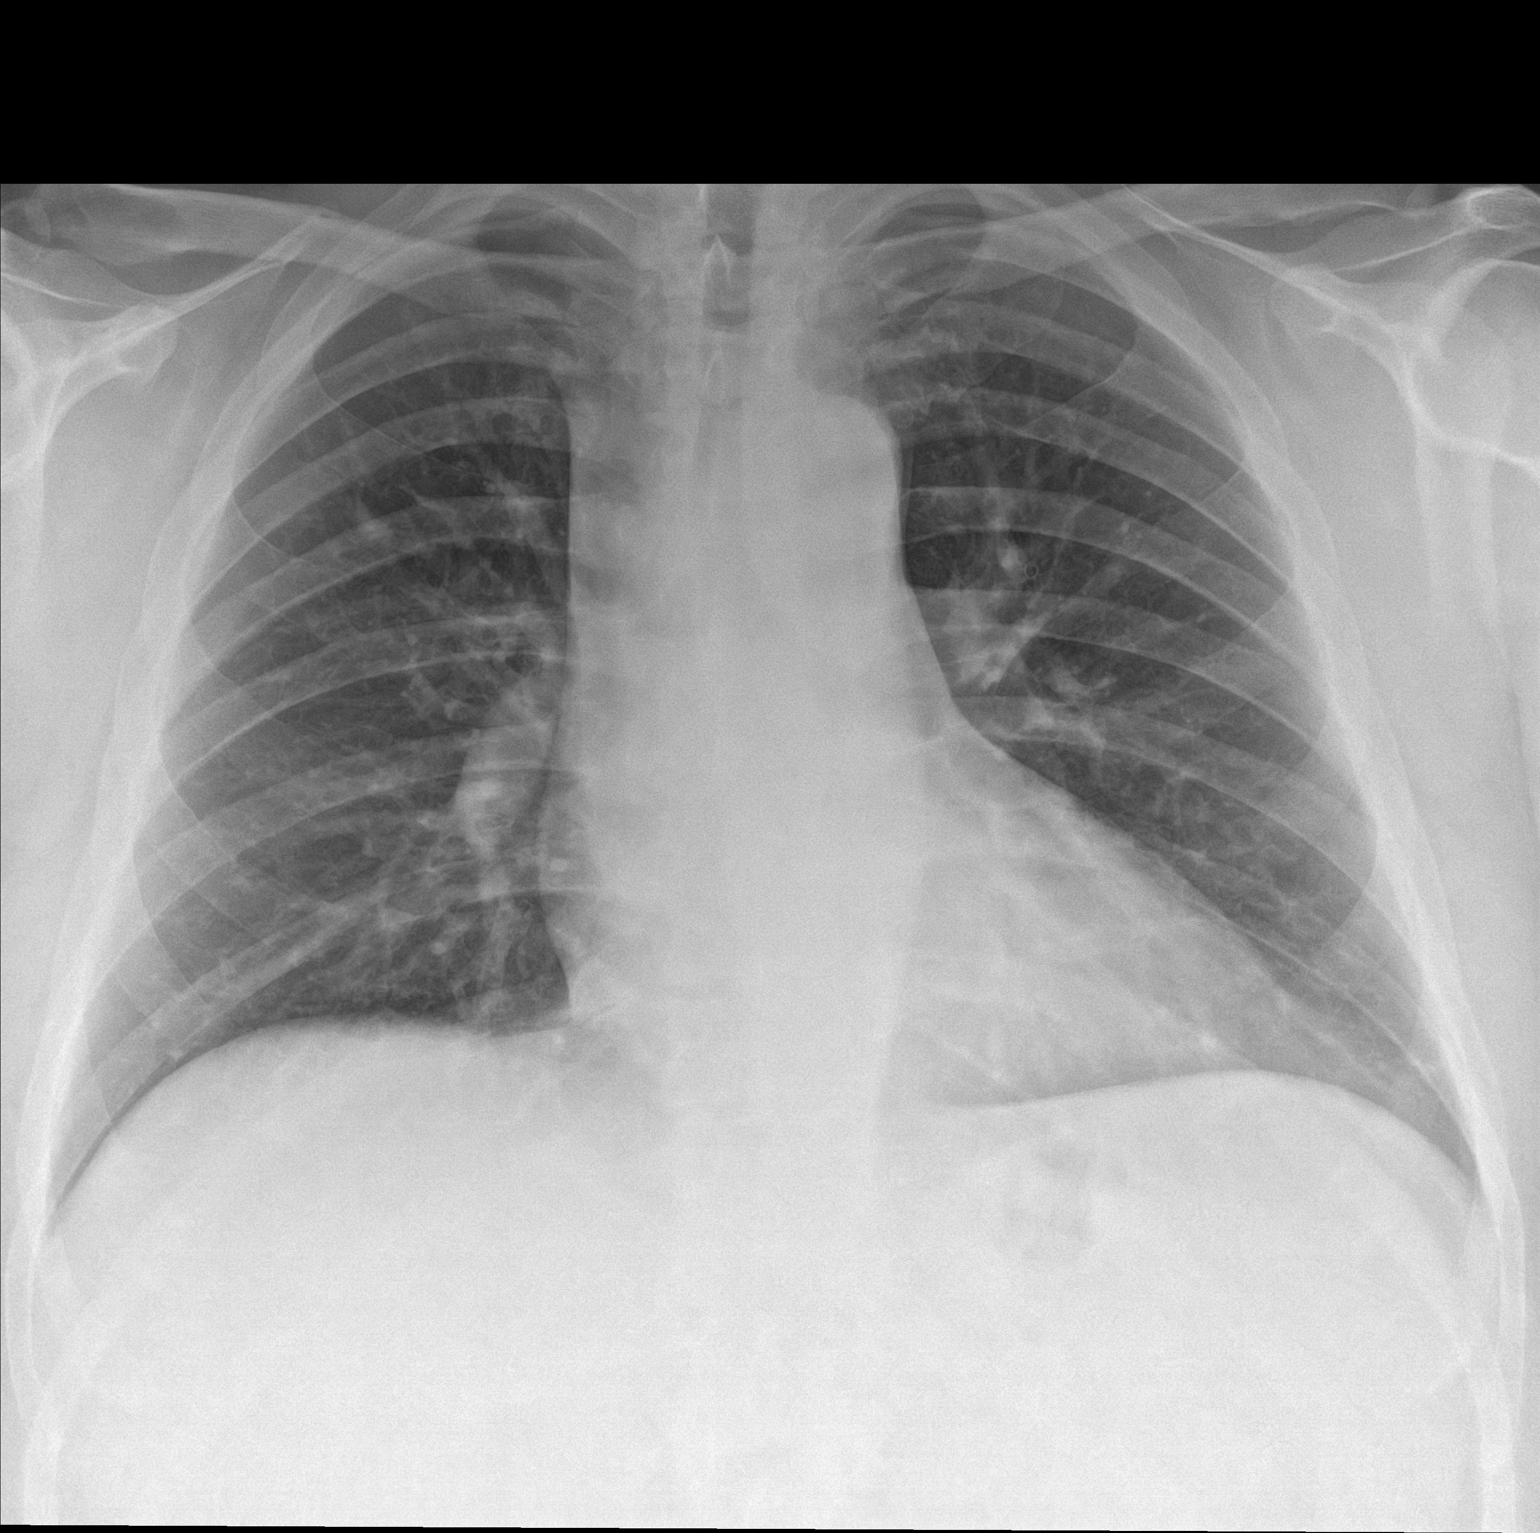

[chest lat]
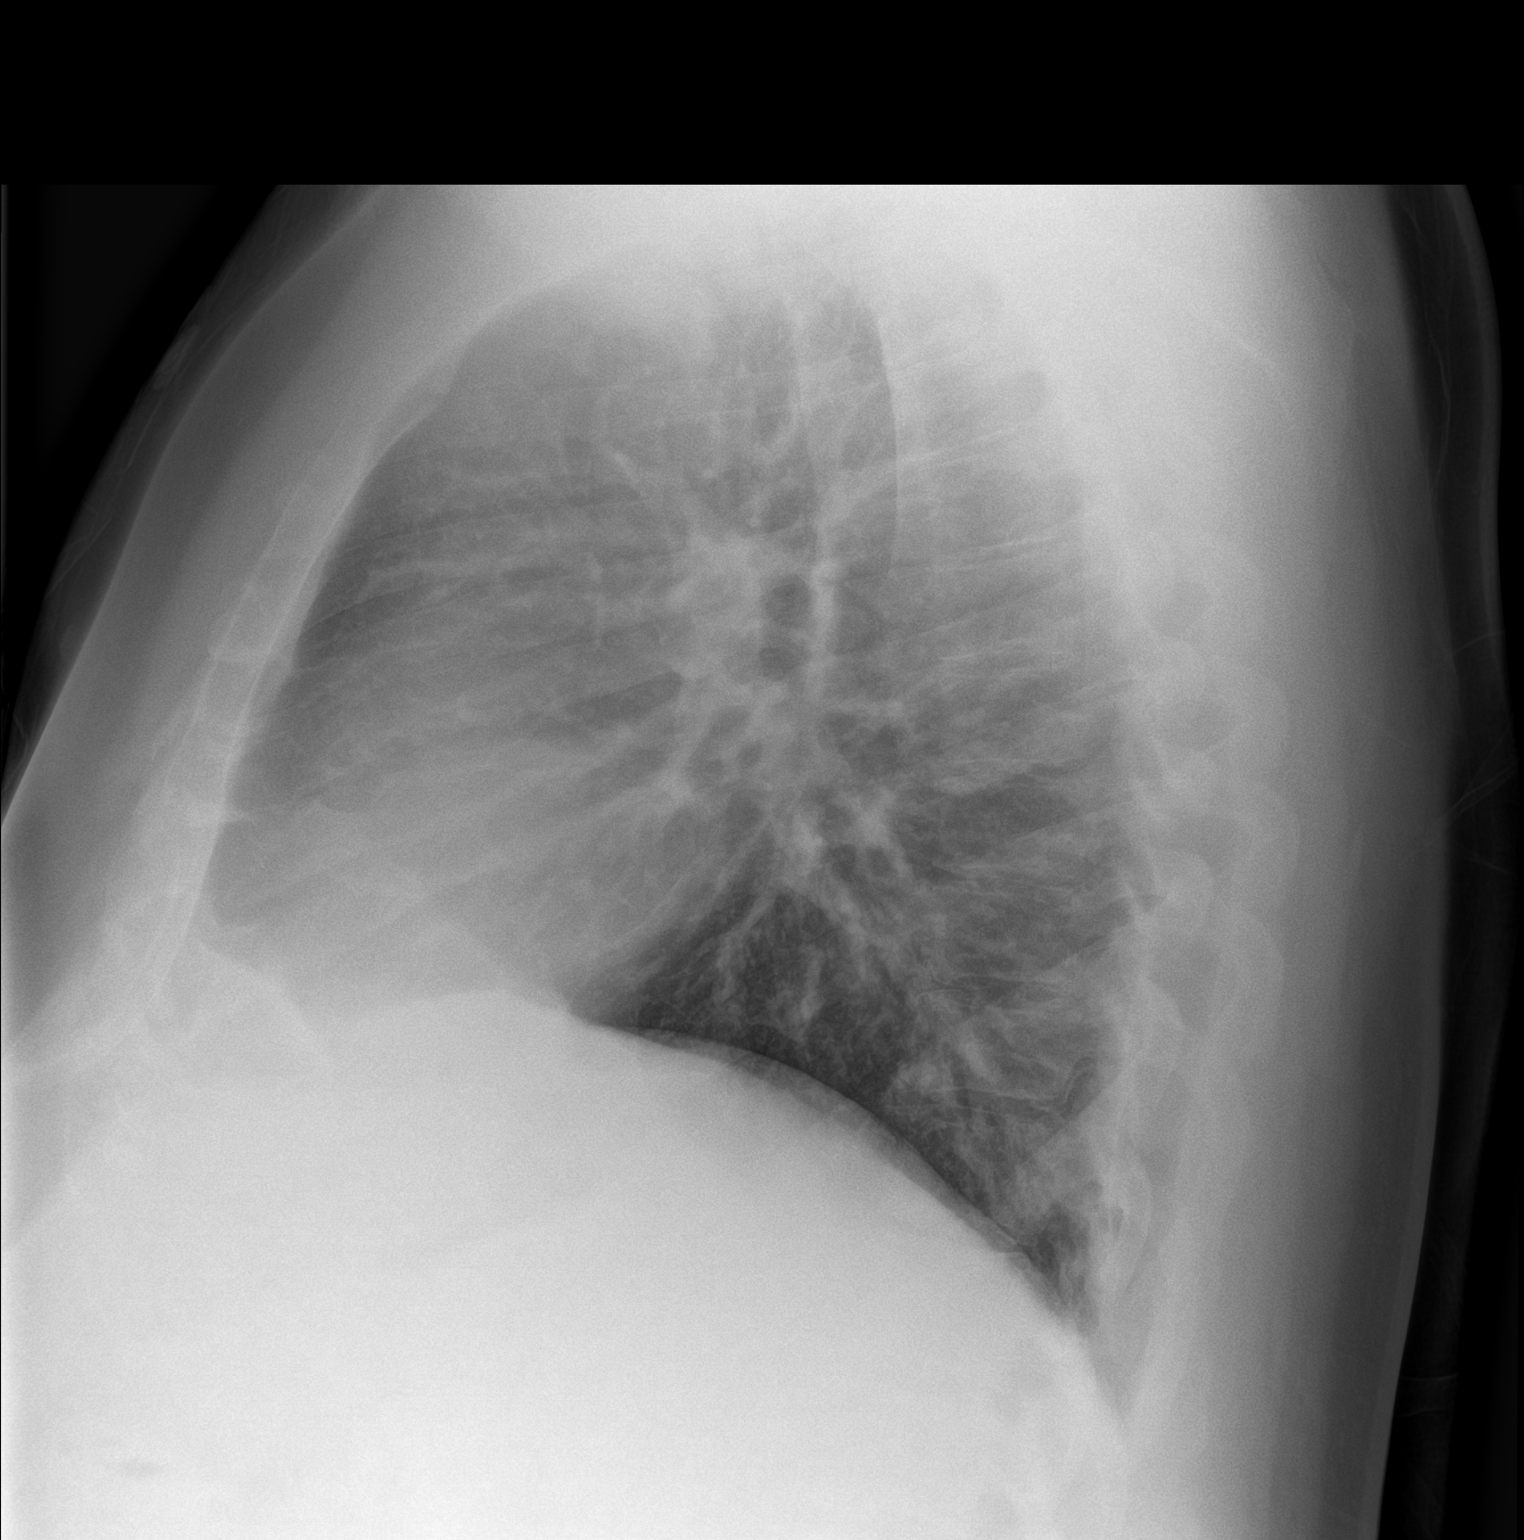

[2 of 2 positions shown; findings below may reference images not displayed]

FINDINGS: The heart size and mediastinal contours are within normal limits.
Both lungs are clear. The visualized skeletal structures are
unremarkable.
IMPRESSION: No active cardiopulmonary disease.

## 2022-06-01 ENCOUNTER — Other Ambulatory Visit (HOSPITAL_BASED_OUTPATIENT_CLINIC_OR_DEPARTMENT_OTHER): Payer: Self-pay

## 2022-06-01 ENCOUNTER — Other Ambulatory Visit: Payer: Self-pay

## 2022-06-02 ENCOUNTER — Other Ambulatory Visit (HOSPITAL_BASED_OUTPATIENT_CLINIC_OR_DEPARTMENT_OTHER): Payer: Self-pay

## 2022-09-11 ENCOUNTER — Encounter (HOSPITAL_BASED_OUTPATIENT_CLINIC_OR_DEPARTMENT_OTHER): Payer: Self-pay | Admitting: Emergency Medicine

## 2022-09-11 ENCOUNTER — Other Ambulatory Visit: Payer: Self-pay

## 2022-09-11 ENCOUNTER — Other Ambulatory Visit (HOSPITAL_BASED_OUTPATIENT_CLINIC_OR_DEPARTMENT_OTHER): Payer: Self-pay

## 2022-09-11 ENCOUNTER — Emergency Department (HOSPITAL_BASED_OUTPATIENT_CLINIC_OR_DEPARTMENT_OTHER)
Admission: EM | Admit: 2022-09-11 | Discharge: 2022-09-11 | Disposition: A | Discharge status: Home / Self Care | Payer: BC Managed Care – PPO | Attending: Emergency Medicine | Admitting: Emergency Medicine

## 2022-09-11 DIAGNOSIS — M10072 Idiopathic gout, left ankle and foot: Secondary | ICD-10-CM | POA: Insufficient documentation

## 2022-09-11 DIAGNOSIS — R0981 Nasal congestion: Secondary | ICD-10-CM | POA: Diagnosis present

## 2022-09-11 DIAGNOSIS — H6641 Suppurative otitis media, unspecified, right ear: Secondary | ICD-10-CM | POA: Insufficient documentation

## 2022-09-11 DIAGNOSIS — M109 Gout, unspecified: Secondary | ICD-10-CM

## 2022-09-11 MED ORDER — PREDNISONE 10 MG PO TABS
ORAL_TABLET | ORAL | 0 refills | Status: DC
  Filled 2022-09-11: qty 21, 6d supply, fill #0

## 2022-09-11 MED ORDER — AMOXICILLIN-POT CLAVULANATE 875-125 MG PO TABS
1.0000 | ORAL_TABLET | Freq: Two times a day (BID) | ORAL | 0 refills | Status: AC
  Filled 2022-09-11: qty 20, 10d supply, fill #0

## 2022-09-11 NOTE — Discharge Instructions (Signed)
Return if any problems.

## 2022-09-11 NOTE — ED Provider Notes (Signed)
[place] EMERGENCY DEPARTMENT AT [facility] Provider Note   CSN: [id number] Arrival date & time: 09/11/22  1124     History  Chief Complaint  Patient presents with   Otalgia    Kyle Bell is a 37 y.o. male.  Patient complains of sinus congestion and pressure patient complains of pain in bilateral ears.  Patient also reports that he has an exacerbation of gout.  Patient complains of redness and pain in his right first toe.  Patient had tubes for ear infections when he was young  The history is provided by the patient. No language interpreter was used.  Otalgia Location:  Bilateral Behind ear:  No abnormality Quality:  Aching Severity:  Moderate Onset quality:  Gradual Timing:  Constant      Home Medications Prior to Admission medications   Medication Sig Start Date End Date Taking? Authorizing Provider  amoxicillin-clavulanate (AUGMENTIN) 875-125 MG tablet Take 1 tablet by mouth 2 (two) times daily. 09/11/22  Yes Cheron Schaumann K, PA-C  predniSONE (DELTASONE) 10 MG tablet Take as directed on attached sheet. 09/11/22  Yes Cheron Schaumann K, PA-C  allopurinol (ZYLOPRIM) 300 MG tablet Take 1 tablet (300 mg total) by mouth daily. 01/03/22     doxycycline (VIBRAMYCIN) 100 MG capsule Take 1 capsule (100 mg total) by mouth 2 (two) times daily. One po bid x 7 days 01/26/21   Palumbo, April, MD  losartan-hydrochlorothiazide (HYZAAR) 100-25 MG tablet Take 1 tablet by mouth daily. 01/23/22     meloxicam (MOBIC) 15 MG tablet Take 1 tablet (15 mg total) by mouth daily. 01/26/21   Palumbo, April, MD  omeprazole (PRILOSEC) 20 MG capsule Take 1 capsule (20 mg total) by mouth daily. 01/23/22         Allergies    Ivp dye [iodinated contrast media]    Review of Systems   Review of Systems  HENT:  Positive for ear pain.   All other systems reviewed and are negative.   Physical Exam Updated Vital Signs BP (!) 181/94   Pulse 94   Temp 98.7 F (37.1 C) (Oral)   Resp 18   Ht  6\' 3"  (1.905 m)   Wt (!) 154.2 kg   SpO2 96%   BMI 42.50 kg/m  Physical Exam Vitals and nursing note reviewed.  Constitutional:      Appearance: He is well-developed.  HENT:     Head: Normocephalic.     Ears:     Comments: Right TM slightly erythematous scarring from previous tubes, left TM erythematous and bulging    Mouth/Throat:     Mouth: Mucous membranes are moist.  Eyes:     Pupils: Pupils are equal, round, and reactive to light.  Pulmonary:     Effort: Pulmonary effort is normal.  Abdominal:     General: There is no distension.  Musculoskeletal:        General: Normal range of motion.     Cervical back: Normal range of motion.  Neurological:     Mental Status: He is alert and oriented to person, place, and time.  Psychiatric:        Mood and Affect: Mood normal.     ED Results / Procedures / Treatments   Labs (all labs ordered are listed, but only abnormal results are displayed) Labs Reviewed - No data to display  EKG None  Radiology No results found.  Procedures Procedures    Medications Ordered in ED Medications - No data to display  ED Course/ Medical Decision Making/ A&P                             Medical Decision Making Patient complains of pain in both years and pain in his right first toe from gout  Risk Prescription drug management. Risk Details: Patient has bilateral otitis media left worse than right.  Patient has gout in his left first toe patient is given a prescription for Augmentin which she states has worked best for him in the past when he has an ear infection he is given prednisone for gout.  Patient is advised to follow-up with his primary care physician.           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    Elson Areas, PA-C 09/11/22 1616    [address] [place], [place] 09/14/22 [phone]

## 2022-09-11 NOTE — ED Triage Notes (Signed)
Pt POV from home, caox4, ambulatory, NAD. Pt c/o L ear pain since last night with sinus pressure reporting hx of same when seasonal allergies worsen. Pt states he "feels like his L ear has been clogged over the past week." Afebrile. Last took Ibuprofen at 0700 this morning.

## 2022-09-11 NOTE — ED Notes (Signed)
Pt discharged to home using teachback Method. Discharge instructions have been discussed with patient and/or family members. Pt verbally acknowledges understanding d/c instructions, has been given opportunity for questions to be answered, and endorses comprehension to checkout at registration before leaving.  

## 2022-09-20 ENCOUNTER — Other Ambulatory Visit (HOSPITAL_BASED_OUTPATIENT_CLINIC_OR_DEPARTMENT_OTHER): Payer: Self-pay

## 2022-09-20 MED ORDER — ALLOPURINOL 300 MG PO TABS
300.0000 mg | ORAL_TABLET | Freq: Every day | ORAL | 3 refills | Status: AC
  Filled 2022-09-20: qty 59, 59d supply, fill #0
  Filled 2022-09-20: qty 31, 31d supply, fill #0
  Filled 2023-01-08: qty 90, 90d supply, fill #1

## 2022-09-20 MED ORDER — LOSARTAN POTASSIUM-HCTZ 100-25 MG PO TABS
1.0000 | ORAL_TABLET | Freq: Every day | ORAL | 3 refills | Status: DC
  Filled 2022-09-20: qty 90, 90d supply, fill #0

## 2022-10-09 ENCOUNTER — Other Ambulatory Visit (HOSPITAL_BASED_OUTPATIENT_CLINIC_OR_DEPARTMENT_OTHER): Payer: Self-pay

## 2022-10-09 MED ORDER — VALACYCLOVIR HCL 500 MG PO TABS
500.0000 mg | ORAL_TABLET | Freq: Every day | ORAL | 3 refills | Status: AC
  Filled 2022-10-09: qty 90, 90d supply, fill #0
  Filled 2023-01-08: qty 90, 90d supply, fill #1
  Filled 2023-04-09: qty 90, 90d supply, fill #2
  Filled 2023-07-11: qty 90, 90d supply, fill #3

## 2022-10-09 MED ORDER — PREDNISONE 10 MG PO TABS
10.0000 mg | ORAL_TABLET | Freq: Every day | ORAL | 0 refills | Status: DC
  Filled 2022-10-09: qty 21, 6d supply, fill #0

## 2022-10-18 ENCOUNTER — Other Ambulatory Visit (HOSPITAL_BASED_OUTPATIENT_CLINIC_OR_DEPARTMENT_OTHER): Payer: Self-pay

## 2022-10-18 MED ORDER — PREDNISONE 10 MG PO TABS
ORAL_TABLET | ORAL | 0 refills | Status: DC
  Filled 2022-10-18: qty 42, 12d supply, fill #0

## 2022-11-17 ENCOUNTER — Other Ambulatory Visit (HOSPITAL_BASED_OUTPATIENT_CLINIC_OR_DEPARTMENT_OTHER): Payer: Self-pay

## 2022-11-17 ENCOUNTER — Encounter (HOSPITAL_BASED_OUTPATIENT_CLINIC_OR_DEPARTMENT_OTHER): Payer: Self-pay | Admitting: Emergency Medicine

## 2022-11-17 ENCOUNTER — Emergency Department (HOSPITAL_BASED_OUTPATIENT_CLINIC_OR_DEPARTMENT_OTHER)
Admission: EM | Admit: 2022-11-17 | Discharge: 2022-11-17 | Disposition: A | Discharge status: Home / Self Care | Payer: BC Managed Care – PPO | Attending: Emergency Medicine | Admitting: Emergency Medicine

## 2022-11-17 DIAGNOSIS — I10 Essential (primary) hypertension: Secondary | ICD-10-CM | POA: Insufficient documentation

## 2022-11-17 DIAGNOSIS — Z79899 Other long term (current) drug therapy: Secondary | ICD-10-CM | POA: Diagnosis not present

## 2022-11-17 DIAGNOSIS — M109 Gout, unspecified: Secondary | ICD-10-CM | POA: Diagnosis present

## 2022-11-17 DIAGNOSIS — Z8547 Personal history of malignant neoplasm of testis: Secondary | ICD-10-CM | POA: Insufficient documentation

## 2022-11-17 MED ORDER — LOSARTAN POTASSIUM 100 MG PO TABS
100.0000 mg | ORAL_TABLET | Freq: Every day | ORAL | 0 refills | Status: DC
  Filled 2022-11-17: qty 30, 30d supply, fill #0

## 2022-11-17 MED ORDER — COLCHICINE 0.6 MG PO TABS
ORAL_TABLET | ORAL | 0 refills | Status: DC
  Filled 2022-11-17: qty 30, 15d supply, fill #0

## 2022-11-17 MED ORDER — PREDNISONE 20 MG PO TABS
40.0000 mg | ORAL_TABLET | Freq: Every day | ORAL | 0 refills | Status: DC
  Filled 2022-11-17: qty 7, 3d supply, fill #0

## 2022-11-17 MED ORDER — HYDROCODONE-ACETAMINOPHEN 5-325 MG PO TABS
1.0000 | ORAL_TABLET | ORAL | 0 refills | Status: AC | PRN
  Filled 2022-11-17: qty 10, 2d supply, fill #0

## 2022-11-17 MED ORDER — NAPROXEN 500 MG PO TABS
500.0000 mg | ORAL_TABLET | Freq: Two times a day (BID) | ORAL | 0 refills | Status: AC | PRN
  Filled 2022-11-17: qty 30, 15d supply, fill #0

## 2022-11-17 MED ORDER — KETOROLAC TROMETHAMINE 30 MG/ML IJ SOLN
30.0000 mg | Freq: Once | INTRAMUSCULAR | Status: AC
  Administered 2022-11-17: 30 mg via INTRAMUSCULAR
  Filled 2022-11-17: qty 1

## 2022-11-17 MED ORDER — DEXAMETHASONE SODIUM PHOSPHATE 10 MG/ML IJ SOLN
10.0000 mg | Freq: Once | INTRAMUSCULAR | Status: AC
  Administered 2022-11-17: 10 mg via INTRAMUSCULAR
  Filled 2022-11-17: qty 1

## 2022-11-17 NOTE — Discharge Instructions (Addendum)
Stop the losartan-hctz and start plain losartan.  You can stop the prednisone if the gout flare goes away.  Hold Allopurinol during the flare.

## 2022-11-17 NOTE — ED Triage Notes (Signed)
Pt here with c/o pain to the right foot,  gout related

## 2022-11-17 NOTE — ED Provider Notes (Signed)
[place] EMERGENCY DEPARTMENT AT [facility] Provider Note   CSN: [id number] Arrival date & time: 11/17/22  1032     History  Chief Complaint  Patient presents with   Foot Pain    Kyle Bell is a 37 y.o. male.  Pt is a 37 yo male with pmhx significant for htn, obesity, testicular cancer, and gout.  Pt has had gout in the past and developed pain consistent with prior episodes yesterday.  Pain last night was severe.         Home Medications Prior to Admission medications   Medication Sig Start Date End Date Taking? Authorizing Provider  colchicine 0.6 MG tablet Take 1.2 mg (2 pills) once.  Take an additional pill (0.6 mg) in 1 hour.  The next day, take 1 pill twice a day until 2 days after flare is gone. 11/17/22  Yes Jacalyn Lefevre, MD  HYDROcodone-acetaminophen (NORCO/VICODIN) 5-325 MG tablet Take 1 tablet by mouth every 4 (four) hours as needed. 11/17/22  Yes Jacalyn Lefevre, MD  losartan (COZAAR) 100 MG tablet Take 1 tablet (100 mg total) by mouth daily. 11/17/22  Yes Jacalyn Lefevre, MD  naproxen (NAPROSYN) 500 MG tablet Take 1 tablet (500 mg total) by mouth 2 (two) times daily as needed for mild pain. 11/17/22  Yes Jacalyn Lefevre, MD  predniSONE (DELTASONE) 20 MG tablet Take 2 tablets (40 mg total) by mouth daily. 11/17/22  Yes Jacalyn Lefevre, MD  allopurinol (ZYLOPRIM) 300 MG tablet Take 1 tablet (300 mg total) by mouth daily. 01/03/22     allopurinol (ZYLOPRIM) 300 MG tablet Take 1 tablet (300 mg total) by mouth daily. 09/20/22     amoxicillin-clavulanate (AUGMENTIN) 875-125 MG tablet Take 1 tablet by mouth 2 (two) times daily. 09/11/22   Elson Areas, PA-C  doxycycline (VIBRAMYCIN) 100 MG capsule Take 1 capsule (100 mg total) by mouth 2 (two) times daily. One po bid x 7 days 01/26/21   Palumbo, April, MD  meloxicam (MOBIC) 15 MG tablet Take 1 tablet (15 mg total) by mouth daily. 01/26/21   Palumbo, April, MD  omeprazole (PRILOSEC) 20 MG capsule Take 1 capsule  (20 mg total) by mouth daily. 01/23/22     valACYclovir (VALTREX) 500 MG tablet Take 1 tablet (500 mg total) by mouth daily. 10/09/22         Allergies    Ivp dye [iodinated contrast media]    Review of Systems   Review of Systems  Musculoskeletal:        Right big toe and ankle pain  All other systems reviewed and are negative.   Physical Exam Updated Vital Signs BP (!) 159/107   Pulse 89   Temp 98.3 F (36.8 C) (Oral)   Resp 18   SpO2 99%  Physical Exam Vitals and nursing note reviewed.  Constitutional:      Appearance: Normal appearance. He is obese.  HENT:     Head: Normocephalic and atraumatic.     Right Ear: External ear normal.     Left Ear: External ear normal.     Nose: Nose normal.     Mouth/Throat:     Mouth: Mucous membranes are moist.     Pharynx: Oropharynx is clear.  Eyes:     Extraocular Movements: Extraocular movements intact.     Conjunctiva/sclera: Conjunctivae normal.     Pupils: Pupils are equal, round, and reactive to light.  Cardiovascular:     Rate and Rhythm: Normal rate and regular rhythm.  Pulses: Normal pulses.     Heart sounds: Normal heart sounds.  Pulmonary:     Effort: Pulmonary effort is normal.     Breath sounds: Normal breath sounds.  Abdominal:     General: Abdomen is flat. Bowel sounds are normal.     Palpations: Abdomen is soft.  Musculoskeletal:     Cervical back: Normal range of motion and neck supple.     Comments: Right big toe and ankle swelling c/w gout  Skin:    General: Skin is warm.     Capillary Refill: Capillary refill takes less than 2 seconds.  Neurological:     General: No focal deficit present.     Mental Status: He is alert and oriented to person, place, and time.  Psychiatric:        Mood and Affect: Mood normal.        Behavior: Behavior normal.     ED Results / Procedures / Treatments   Labs (all labs ordered are listed, but only abnormal results are displayed) Labs Reviewed - No data to  display  EKG None  Radiology No results found.  Procedures Procedures    Medications Ordered in ED Medications  dexamethasone (DECADRON) injection 10 mg (10 mg Intramuscular Given 11/17/22 1057)  ketorolac (TORADOL) 30 MG/ML injection 30 mg (30 mg Intramuscular Given 11/17/22 1057)    ED Course/ Medical Decision Making/ A&P                             Medical Decision Making Risk Prescription drug management.   This patient presents to the ED for concern of toe and ankle pain, this involves an extensive number of treatment options, and is a complaint that carries with it a high risk of complications and morbidity.  The differential diagnosis includes gout, cellulitis   Co morbidities that complicate the patient evaluation  htn, obesity, testicular cancer, and gout   Additional history obtained:  Additional history obtained from epic chart review  Medicines ordered and prescription drug management:  I ordered medication including toradol and decadron  for sx  Reevaluation of the patient after these medicines showed that the patient improved I have reviewed the patients home medicines and have made adjustments as needed  Critical Interventions:  Pain control   Problem List / ED Course:  Gout:  sx c/w prior episodes.  Pt given toradol and decadron in ED.  I have changed his bp med to stop hctz.  He is d/c with prednisone, colchicine, and lortab.  Return if worse.  Eat a low purine diet.  F/u with pcp.   Reevaluation:  After the interventions noted above, I reevaluated the patient and found that they have :improved   Social Determinants of Health:  Lives at home   Dispostion:  After consideration of the diagnostic results and the patients response to treatment, I feel that the patent would benefit from discharge with outpatient f/u.          Final Clinical Impression(s) / ED Diagnoses Final diagnoses:  Acute gout of right foot, unspecified cause     Rx / DC Orders ED Discharge Orders          Ordered    losartan (COZAAR) 100 MG tablet  Daily        11/17/22 1050    predniSONE (DELTASONE) 20 MG tablet  Daily        11/17/22 1054    colchicine  0.6 MG tablet        11/17/22 1054    HYDROcodone-acetaminophen (NORCO/VICODIN) 5-325 MG tablet  Every 4 hours PRN        11/17/22 1054    naproxen (NAPROSYN) 500 MG tablet  2 times daily PRN        11/17/22 1054              Jacalyn Lefevre, MD 11/17/22 1058

## 2022-12-19 ENCOUNTER — Other Ambulatory Visit (HOSPITAL_BASED_OUTPATIENT_CLINIC_OR_DEPARTMENT_OTHER): Payer: Self-pay

## 2022-12-19 ENCOUNTER — Other Ambulatory Visit: Payer: Self-pay

## 2022-12-19 MED ORDER — LOSARTAN POTASSIUM 100 MG PO TABS
100.0000 mg | ORAL_TABLET | Freq: Every day | ORAL | 4 refills | Status: AC
  Filled 2022-12-19: qty 90, 90d supply, fill #0
  Filled 2023-03-19: qty 90, 90d supply, fill #1
  Filled 2023-06-16 (×2): qty 90, 90d supply, fill #2
  Filled 2023-09-14 – 2023-09-15 (×2): qty 90, 90d supply, fill #3
  Filled 2023-12-09: qty 90, 90d supply, fill #4

## 2023-01-23 ENCOUNTER — Encounter (HOSPITAL_BASED_OUTPATIENT_CLINIC_OR_DEPARTMENT_OTHER): Payer: Self-pay

## 2023-01-23 ENCOUNTER — Emergency Department (HOSPITAL_BASED_OUTPATIENT_CLINIC_OR_DEPARTMENT_OTHER)
Admission: EM | Admit: 2023-01-23 | Discharge: 2023-01-23 | Disposition: A | Discharge status: Home / Self Care | Payer: BC Managed Care – PPO | Attending: Emergency Medicine | Admitting: Emergency Medicine

## 2023-01-23 ENCOUNTER — Other Ambulatory Visit: Payer: Self-pay

## 2023-01-23 ENCOUNTER — Other Ambulatory Visit (HOSPITAL_BASED_OUTPATIENT_CLINIC_OR_DEPARTMENT_OTHER): Payer: Self-pay

## 2023-01-23 DIAGNOSIS — M25571 Pain in right ankle and joints of right foot: Secondary | ICD-10-CM | POA: Diagnosis present

## 2023-01-23 DIAGNOSIS — Z8547 Personal history of malignant neoplasm of testis: Secondary | ICD-10-CM | POA: Insufficient documentation

## 2023-01-23 DIAGNOSIS — Z79899 Other long term (current) drug therapy: Secondary | ICD-10-CM | POA: Diagnosis not present

## 2023-01-23 DIAGNOSIS — I10 Essential (primary) hypertension: Secondary | ICD-10-CM | POA: Diagnosis not present

## 2023-01-23 DIAGNOSIS — R030 Elevated blood-pressure reading, without diagnosis of hypertension: Secondary | ICD-10-CM

## 2023-01-23 MED ORDER — PREDNISONE 20 MG PO TABS
40.0000 mg | ORAL_TABLET | Freq: Every day | ORAL | 0 refills | Status: DC
  Filled 2023-01-23: qty 10, 5d supply, fill #0

## 2023-01-23 MED ORDER — KETOROLAC TROMETHAMINE 15 MG/ML IJ SOLN
15.0000 mg | Freq: Once | INTRAMUSCULAR | Status: AC
  Administered 2023-01-23: 15 mg via INTRAMUSCULAR
  Filled 2023-01-23: qty 1

## 2023-01-23 MED ORDER — COLCHICINE 0.6 MG PO TABS
1.2000 mg | ORAL_TABLET | Freq: Once | ORAL | 0 refills | Status: AC
  Filled 2023-01-23: qty 30, 15d supply, fill #0

## 2023-01-23 MED ORDER — PREDNISONE 10 MG PO TABS
ORAL_TABLET | ORAL | 0 refills | Status: AC
  Filled 2023-01-23: qty 42, 12d supply, fill #0

## 2023-01-23 MED ORDER — DEXAMETHASONE SODIUM PHOSPHATE 10 MG/ML IJ SOLN
10.0000 mg | Freq: Once | INTRAMUSCULAR | Status: AC
  Administered 2023-01-23: 10 mg via INTRAMUSCULAR
  Filled 2023-01-23: qty 1

## 2023-01-23 NOTE — ED Triage Notes (Signed)
Pt to ED requesting medication refill for gout. Reports unable to get in with PCP. Voicing no other complaints

## 2023-01-23 NOTE — ED Notes (Signed)
Provider notified abt b/p. Pt denies HA or CP, declines EKG. Pt reports "normal b/p" when not at hospital

## 2023-01-23 NOTE — ED Notes (Signed)
Pt discharged to home using teachback Method. Discharge instructions have been discussed with patient and/or family members. Pt verbally acknowledges understanding d/c instructions, has been given opportunity for questions to be answered, and endorses comprehension to checkout at registration before leaving.  

## 2023-01-23 NOTE — Discharge Instructions (Addendum)
It was a pleasure taking care of you today. As discussed, I suspect you are having a gout flare. You were given a shot of steroids and toradol in the ER. I am sending you home with steroids and colchicine. Take as prescribed. Your BP was elevated in the ER. Please have your PCP recheck your BP in 2-3 days. Return to the ER for new or worsening symptoms

## 2023-01-23 NOTE — ED Provider Notes (Signed)
[place] EMERGENCY DEPARTMENT AT [facility] Provider Note   CSN: [id number] Arrival date & time: 01/23/23  7253     History  Chief Complaint  Patient presents with   Medication Refill    gout    Kyle Bell is a 37 y.o. male with a past medical history significant for hypertension, obesity, testicular cancer, and history of gout who presents to the ED due to right ankle pain consistent with previous gout flares.  Patient requesting a refill on his prednisone and colchicine.  No history of DM. Symptoms started last night.  No injury to right ankle.  Pain goes from right ankle into right great toe.  This is typical for his gout flares.  No fever or chills.  Denies difficulties moving right ankle.  No other concerns.  History obtained from patient and past medical records. No interpreter used during encounter.       Home Medications Prior to Admission medications   Medication Sig Start Date End Date Taking? Authorizing Provider  predniSONE (DELTASONE) 20 MG tablet Take 2 tablets (40 mg total) by mouth daily for 5 days. 01/23/23 01/28/23 Yes Bird Tailor, Merla Riches, PA-C  allopurinol (ZYLOPRIM) 300 MG tablet Take 1 tablet (300 mg total) by mouth daily. 01/03/22     allopurinol (ZYLOPRIM) 300 MG tablet Take 1 tablet (300 mg total) by mouth daily. 09/20/22     amoxicillin-clavulanate (AUGMENTIN) 875-125 MG tablet Take 1 tablet by mouth 2 (two) times daily. 09/11/22   Elson Areas, PA-C  colchicine 0.6 MG tablet Take 1.2 mg (2 pills) once.  Take an additional pill (0.6 mg) in 1 hour.  The next day, take 1 pill twice a day until 2 days after flare is gone. 01/23/23   Mannie Stabile, PA-C  doxycycline (VIBRAMYCIN) 100 MG capsule Take 1 capsule (100 mg total) by mouth 2 (two) times daily. One po bid x 7 days 01/26/21   Nicanor Alcon, April, MD  HYDROcodone-acetaminophen (NORCO/VICODIN) 5-325 MG tablet Take 1 tablet by mouth every 4 (four) hours as needed. 11/17/22   Jacalyn Lefevre, MD   losartan (COZAAR) 100 MG tablet Take 1 tablet (100 mg total) by mouth daily. 12/19/22     meloxicam (MOBIC) 15 MG tablet Take 1 tablet (15 mg total) by mouth daily. 01/26/21   Palumbo, April, MD  naproxen (NAPROSYN) 500 MG tablet Take 1 tablet (500 mg total) by mouth 2 (two) times daily as needed for mild pain. 11/17/22   Jacalyn Lefevre, MD  omeprazole (PRILOSEC) 20 MG capsule Take 1 capsule (20 mg total) by mouth daily. 01/23/22     predniSONE (DELTASONE) 10 MG tablet Take 6 tablets (60 mg total) by mouth daily for 2 days, THEN 5 tablets (50 mg total) daily for 2 days, THEN 4 tablets (40 mg total) daily for 2 days, THEN 3 tablets (30 mg total) daily for 2 days, THEN 2 tablets (20 mg total) daily for 2 days, THEN 1 tablet (10 mg total) daily for 2 days. 01/23/23 02/04/23    valACYclovir (VALTREX) 500 MG tablet Take 1 tablet (500 mg total) by mouth daily. 10/09/22         Allergies    Ivp dye [iodinated contrast media]    Review of Systems   Review of Systems  Constitutional:  Negative for fever.  Musculoskeletal:  Positive for arthralgias and joint swelling.    Physical Exam Updated Vital Signs BP (!) 182/112   Pulse 88   Temp 97.6 F (36.4 C)  Resp 19   Ht 6\' 2"  (1.88 m)   Wt (!) 145.2 kg   SpO2 98%   BMI 41.09 kg/m  Physical Exam Vitals and nursing note reviewed.  Constitutional:      General: He is not in acute distress.    Appearance: He is not ill-appearing.  HENT:     Head: Normocephalic.  Eyes:     Pupils: Pupils are equal, round, and reactive to light.  Cardiovascular:     Rate and Rhythm: Normal rate and regular rhythm.     Pulses: Normal pulses.     Heart sounds: Normal heart sounds. No murmur heard.    No friction rub. No gallop.  Pulmonary:     Effort: Pulmonary effort is normal.     Breath sounds: Normal breath sounds.  Abdominal:     General: Abdomen is flat. There is no distension.     Palpations: Abdomen is soft.     Tenderness: There is no abdominal  tenderness. There is no guarding or rebound.  Musculoskeletal:        General: Normal range of motion.     Cervical back: Neck supple.     Comments: TTP throughout medial aspect of right ankle. Full ROM of right ankle. Pedal pulses palpable.  Skin:    General: Skin is warm and dry.  Neurological:     General: No focal deficit present.     Mental Status: He is alert.  Psychiatric:        Mood and Affect: Mood normal.        Behavior: Behavior normal.     ED Results / Procedures / Treatments   Labs (all labs ordered are listed, but only abnormal results are displayed) Labs Reviewed - No data to display  EKG None  Radiology No results found.  Procedures Procedures    Medications Ordered in ED Medications  dexamethasone (DECADRON) injection 10 mg (10 mg Intramuscular Given 01/23/23 0920)  ketorolac (TORADOL) 15 MG/ML injection 15 mg (15 mg Intramuscular Given 01/23/23 0920)    ED Course/ Medical Decision Making/ A&P                                 Medical Decision Making Amount and/or Complexity of Data Reviewed External Data Reviewed: notes.  Risk Prescription drug management.   37 year old male presents to the ED due to right ankle pain consistent with previous gout flares.  Patient requesting a refill on his colchicine and prednisone.  Seen in June 2024 where he was given Decadron and Toradol with significant improvement in flare and requesting that at this time.  No injury.  Symptoms started yesterday.  No fever or chills.  Upon arrival patient hypertensive 172/111.  Patient denies headache, visual changes, chest pain, shortness of breath.  Patient notes his BP has typically been running in the 130s.  He has been compliant with his BP medication.  Low suspicion for hypertensive urgency/emergency.  Feel elevated BP likely secondary to pain.  On exam tenderness throughout medial aspect of right ankle with mild edema.  Full range of motion of right ankle.  Low suspicion  for septic joint.  Pedal pulses palpable. No injury to suggest bony fracture  Suspect symptoms related to gout flare. Patient given Toradol and Decadron here in the ED.  Discharged with colchicine and prednisone.  No history of diabetes.  Upon BP recheck. BP slightly improved. Advised patient to have  BP rechecked by PCP in 2-3 days. Patient stable for discharge. Strict ED precautions discussed with patient. Patient states understanding and agrees to plan. Patient discharged home in no acute distress and stable vitals  Lives at home Hx gout Has PCP       Final Clinical Impression(s) / ED Diagnoses Final diagnoses:  Acute right ankle pain  Elevated blood pressure reading    Rx / DC Orders ED Discharge Orders          Ordered    colchicine 0.6 MG tablet        01/23/23 0927    predniSONE (DELTASONE) 20 MG tablet  Daily        01/23/23 0927              Mannie Stabile, PA-C 01/23/23 1006    [employer], [place], [place] 01/23/23 1606

## 2023-01-23 NOTE — ED Notes (Signed)
Pt deneis CP or shob. Reports RT ankle swelling, hx of gout.

## 2023-03-19 ENCOUNTER — Other Ambulatory Visit (HOSPITAL_BASED_OUTPATIENT_CLINIC_OR_DEPARTMENT_OTHER): Payer: Self-pay

## 2023-06-16 ENCOUNTER — Other Ambulatory Visit (HOSPITAL_BASED_OUTPATIENT_CLINIC_OR_DEPARTMENT_OTHER): Payer: Self-pay

## 2023-07-13 ENCOUNTER — Other Ambulatory Visit (HOSPITAL_BASED_OUTPATIENT_CLINIC_OR_DEPARTMENT_OTHER): Payer: Self-pay

## 2023-08-03 ENCOUNTER — Other Ambulatory Visit (HOSPITAL_BASED_OUTPATIENT_CLINIC_OR_DEPARTMENT_OTHER): Payer: Self-pay

## 2023-08-03 MED ORDER — PREDNISONE 10 MG PO TABS
ORAL_TABLET | ORAL | 0 refills | Status: AC
  Filled 2023-08-03: qty 21, 6d supply, fill #0
  Filled 2023-08-03: qty 42, 12d supply, fill #0

## 2023-08-03 MED ORDER — PREDNISONE 20 MG PO TABS
ORAL_TABLET | ORAL | 0 refills | Status: AC
  Filled 2023-08-03: qty 21, 14d supply, fill #0

## 2023-09-06 ENCOUNTER — Encounter (HOSPITAL_BASED_OUTPATIENT_CLINIC_OR_DEPARTMENT_OTHER): Payer: Self-pay

## 2023-09-06 ENCOUNTER — Other Ambulatory Visit: Payer: Self-pay

## 2023-09-06 ENCOUNTER — Emergency Department (HOSPITAL_BASED_OUTPATIENT_CLINIC_OR_DEPARTMENT_OTHER)
Admission: EM | Admit: 2023-09-06 | Discharge: 2023-09-06 | Discharge status: Against Medical Advice | Attending: Emergency Medicine | Admitting: Emergency Medicine

## 2023-09-06 ENCOUNTER — Emergency Department (HOSPITAL_BASED_OUTPATIENT_CLINIC_OR_DEPARTMENT_OTHER): Admitting: Radiology

## 2023-09-06 DIAGNOSIS — M25562 Pain in left knee: Secondary | ICD-10-CM | POA: Insufficient documentation

## 2023-09-06 DIAGNOSIS — Z5321 Procedure and treatment not carried out due to patient leaving prior to being seen by health care provider: Secondary | ICD-10-CM | POA: Insufficient documentation

## 2023-09-06 NOTE — ED Notes (Signed)
 Called patient's name in lobby, both bathrooms, subwaiting, and outside. No answer. No patient found.

## 2023-09-06 NOTE — ED Notes (Signed)
 Called patient's name in lobby, both bathrooms, subwaiting, and outside. No answer.

## 2023-09-06 NOTE — ED Notes (Signed)
 Called name in lobby, outside, or in bathrooms. No answer.

## 2023-09-06 NOTE — ED Triage Notes (Signed)
 Left knee pain x 3 days, no known injury.

## 2023-09-12 ENCOUNTER — Other Ambulatory Visit: Payer: Self-pay

## 2023-09-12 ENCOUNTER — Other Ambulatory Visit (HOSPITAL_BASED_OUTPATIENT_CLINIC_OR_DEPARTMENT_OTHER): Payer: Self-pay

## 2023-09-12 ENCOUNTER — Emergency Department (HOSPITAL_BASED_OUTPATIENT_CLINIC_OR_DEPARTMENT_OTHER)
Admission: EM | Admit: 2023-09-12 | Discharge: 2023-09-12 | Disposition: A | Discharge status: Home / Self Care | Attending: Emergency Medicine | Admitting: Emergency Medicine

## 2023-09-12 ENCOUNTER — Encounter (HOSPITAL_BASED_OUTPATIENT_CLINIC_OR_DEPARTMENT_OTHER): Payer: Self-pay

## 2023-09-12 DIAGNOSIS — H02842 Edema of right lower eyelid: Secondary | ICD-10-CM | POA: Diagnosis present

## 2023-09-12 DIAGNOSIS — H5789 Other specified disorders of eye and adnexa: Secondary | ICD-10-CM

## 2023-09-12 MED ORDER — METHYLPREDNISOLONE 4 MG PO TBPK
ORAL_TABLET | ORAL | 0 refills | Status: AC
  Filled 2023-09-12: qty 21, 6d supply, fill #0

## 2023-09-12 MED ORDER — ERYTHROMYCIN 5 MG/GM OP OINT
TOPICAL_OINTMENT | Freq: Four times a day (QID) | OPHTHALMIC | Status: DC
  Administered 2023-09-12: 1 via OPHTHALMIC
  Filled 2023-09-12: qty 3.5

## 2023-09-12 NOTE — ED Triage Notes (Signed)
 In for eval of swelling and erythema to lower eyelid onset yesterday.

## 2023-09-12 NOTE — Discharge Instructions (Signed)
 Overall the inflammation and swelling to your eyelid I do suspect could be from allergy process.  Take Medrol Dosepak as prescribed.  I have also prescribed you antibiotic in case there is a mild inflammation of your eyelid or pinkeye.  It is called erythromycin ointment take it 4 times a day for the next 3 to 5 days.  Return if symptoms worsen as we discussed.

## 2023-09-12 NOTE — ED Provider Notes (Signed)
 [place] EMERGENCY DEPARTMENT AT [facility] Provider Note   CSN: [id number] Arrival date & time: 09/12/23  4696     History  Chief Complaint  Patient presents with   Eye Problem    Kyle Bell is a 38 y.o. male.  Is here with redness and swelling to the right lower eyelid.  Denies any vision loss or eye pain.  No trauma history.  No sick contacts.  No nausea vomiting diarrhea.  No difficulty swallowing or eating.  May be been using a new facial cleanser otherwise.  Denies any ear pain.  The history is provided by the patient.       Home Medications Prior to Admission medications   Medication Sig Start Date End Date Taking? Authorizing Provider  methylPREDNISolone (MEDROL DOSEPAK) 4 MG TBPK tablet Follow package insert 09/12/23  Yes Florence Yeung, DO  allopurinol (ZYLOPRIM) 300 MG tablet Take 1 tablet (300 mg total) by mouth daily. 01/03/22     allopurinol (ZYLOPRIM) 300 MG tablet Take 1 tablet (300 mg total) by mouth daily. 09/20/22     amoxicillin-clavulanate (AUGMENTIN) 875-125 MG tablet Take 1 tablet by mouth 2 (two) times daily. 09/11/22   Elson Areas, PA-C  colchicine 0.6 MG tablet Take 2 tablets (1.2 mg total) by mouth once for 1 dose. Take an additional tablet (0.6 mg) in 1 hour.  The next day, take 1 tablet twice a day until 2 days after flare is gone. 01/23/23 01/24/23  Mannie Stabile, PA-C  doxycycline (VIBRAMYCIN) 100 MG capsule Take 1 capsule (100 mg total) by mouth 2 (two) times daily. One po bid x 7 days 01/26/21   Nicanor Alcon, April, MD  HYDROcodone-acetaminophen (NORCO/VICODIN) 5-325 MG tablet Take 1 tablet by mouth every 4 (four) hours as needed. 11/17/22   Jacalyn Lefevre, MD  losartan (COZAAR) 100 MG tablet Take 1 tablet (100 mg total) by mouth daily. 12/19/22     meloxicam (MOBIC) 15 MG tablet Take 1 tablet (15 mg total) by mouth daily. 01/26/21   Palumbo, April, MD  naproxen (NAPROSYN) 500 MG tablet Take 1 tablet (500 mg total) by mouth 2 (two)  times daily as needed for mild pain. 11/17/22   Jacalyn Lefevre, MD  omeprazole (PRILOSEC) 20 MG capsule Take 1 capsule (20 mg total) by mouth daily. 01/23/22     predniSONE (DELTASONE) 20 MG tablet Take 3 tablets (equals 60mg ) once daily for 3 days, 2 tablets once daily for 3 days, 1 tablet daily for 4 days, 1/2 tablet daily for 4 days 08/03/23     predniSONE (DELTASONE) 10 MG tablet Take 6 tabs daily for 2 days, 5 tabs daily for 2 days, 4 tabs daily for 2 days, 3 tabs daily for 2 days, 2 tabs for 2 days, 1 tab daily for 2 days then stop. 08/03/23     valACYclovir (VALTREX) 500 MG tablet Take 1 tablet (500 mg total) by mouth daily. 10/09/22         Allergies    Ivp dye [iodinated contrast media]    Review of Systems   Review of Systems  Physical Exam Updated Vital Signs BP (!) 145/109 (BP Location: Right Arm)   Pulse 90   Temp 98 F (36.7 C) (Oral)   Resp 20   Ht 6\' 2"  (1.88 m)   Wt 131.5 kg   SpO2 98%   BMI 37.23 kg/m  Physical Exam Vitals and nursing note reviewed.  Constitutional:      General: He is not  in acute distress.    Appearance: He is well-developed.  HENT:     Head: Normocephalic and atraumatic.  Eyes:     Extraocular Movements: Extraocular movements intact.     Conjunctiva/sclera: Conjunctivae normal.     Pupils: Pupils are equal, round, and reactive to light.     Comments: Got some mild swelling to the right lower eyelid no purulent drainage, conjunctiva is clear, no pain with extraocular movement  Cardiovascular:     Rate and Rhythm: Normal rate and regular rhythm.     Heart sounds: No murmur heard. Pulmonary:     Effort: Pulmonary effort is normal. No respiratory distress.     Breath sounds: Normal breath sounds.  Abdominal:     Palpations: Abdomen is soft.     Tenderness: There is no abdominal tenderness.  Musculoskeletal:        General: No swelling.     Cervical back: Neck supple.  Skin:    General: Skin is warm and dry.     Capillary Refill:  Capillary refill takes less than 2 seconds.  Neurological:     Mental Status: He is alert.  Psychiatric:        Mood and Affect: Mood normal.     ED Results / Procedures / Treatments   Labs (all labs ordered are listed, but only abnormal results are displayed) Labs Reviewed - No data to display  EKG None  Radiology No results found.  Procedures Procedures    Medications Ordered in ED Medications  erythromycin ophthalmic ointment (has no administration in time range)    ED Course/ Medical Decision Making/ A&P                                 Medical Decision Making Risk Prescription drug management.   Kyle Bell is here with swelling on the right lower eyelid.  Started yesterday.  Normal vitals.  No fever.  History of testicular cancer and hypertension.  Differential diagnosis likely allergy related process or could be blepharitis/pinkeye.  Ultimately will treat with Medrol Dosepak, erythromycin ointment.  Is not having any eye pain.  He has normal extraocular movement.  I have no concern for deep space infectious process or systemic allergic process.  Given return precautions.  Recommend that he discontinue facial cleanser that he has been using as well.  Told to return if symptoms worsen.  This chart was dictated using voice recognition software.  Despite best efforts to proofread,  errors can occur which can change the documentation meaning.         Final Clinical Impression(s) / ED Diagnoses Final diagnoses:  Eye swelling    Rx / DC Orders ED Discharge Orders          Ordered    methylPREDNISolone (MEDROL DOSEPAK) 4 MG TBPK tablet        09/12/23 0851              Virgina Norfolk, DO 09/12/23 1610

## 2023-09-15 ENCOUNTER — Other Ambulatory Visit (HOSPITAL_BASED_OUTPATIENT_CLINIC_OR_DEPARTMENT_OTHER): Payer: Self-pay

## 2023-11-22 ENCOUNTER — Encounter (HOSPITAL_BASED_OUTPATIENT_CLINIC_OR_DEPARTMENT_OTHER): Payer: Self-pay

## 2023-11-22 ENCOUNTER — Other Ambulatory Visit: Payer: Self-pay

## 2023-11-22 ENCOUNTER — Other Ambulatory Visit (HOSPITAL_BASED_OUTPATIENT_CLINIC_OR_DEPARTMENT_OTHER): Payer: Self-pay

## 2023-11-22 MED ORDER — DOXYCYCLINE HYCLATE 100 MG PO CAPS
100.0000 mg | ORAL_CAPSULE | Freq: Two times a day (BID) | ORAL | 0 refills | Status: AC
  Filled 2023-11-22: qty 180, 90d supply, fill #0

## 2023-11-22 MED ORDER — TRETINOIN 0.025 % EX CREA
1.0000 | TOPICAL_CREAM | Freq: Every day | CUTANEOUS | 1 refills | Status: AC
  Filled 2023-11-22: qty 45, 90d supply, fill #0

## 2023-12-10 ENCOUNTER — Other Ambulatory Visit (HOSPITAL_BASED_OUTPATIENT_CLINIC_OR_DEPARTMENT_OTHER): Payer: Self-pay

## 2024-03-13 ENCOUNTER — Other Ambulatory Visit (HOSPITAL_BASED_OUTPATIENT_CLINIC_OR_DEPARTMENT_OTHER): Payer: Self-pay

## 2024-03-14 ENCOUNTER — Other Ambulatory Visit (HOSPITAL_BASED_OUTPATIENT_CLINIC_OR_DEPARTMENT_OTHER): Payer: Self-pay

## 2024-03-14 ENCOUNTER — Encounter (HOSPITAL_BASED_OUTPATIENT_CLINIC_OR_DEPARTMENT_OTHER): Payer: Self-pay

## 2024-03-21 ENCOUNTER — Other Ambulatory Visit (HOSPITAL_BASED_OUTPATIENT_CLINIC_OR_DEPARTMENT_OTHER): Payer: Self-pay

## 2024-03-21 MED ORDER — LOSARTAN POTASSIUM 100 MG PO TABS
100.0000 mg | ORAL_TABLET | Freq: Every day | ORAL | 0 refills | Status: DC
  Filled 2024-03-21: qty 30, 30d supply, fill #0

## 2024-03-22 ENCOUNTER — Other Ambulatory Visit (HOSPITAL_BASED_OUTPATIENT_CLINIC_OR_DEPARTMENT_OTHER): Payer: Self-pay

## 2024-03-26 ENCOUNTER — Other Ambulatory Visit (HOSPITAL_BASED_OUTPATIENT_CLINIC_OR_DEPARTMENT_OTHER): Payer: Self-pay

## 2024-04-02 ENCOUNTER — Other Ambulatory Visit (HOSPITAL_BASED_OUTPATIENT_CLINIC_OR_DEPARTMENT_OTHER): Payer: Self-pay

## 2024-04-14 ENCOUNTER — Other Ambulatory Visit (HOSPITAL_BASED_OUTPATIENT_CLINIC_OR_DEPARTMENT_OTHER): Payer: Self-pay

## 2024-05-12 ENCOUNTER — Other Ambulatory Visit (HOSPITAL_BASED_OUTPATIENT_CLINIC_OR_DEPARTMENT_OTHER): Payer: Self-pay

## 2024-06-07 ENCOUNTER — Other Ambulatory Visit (HOSPITAL_BASED_OUTPATIENT_CLINIC_OR_DEPARTMENT_OTHER): Payer: Self-pay

## 2024-06-09 ENCOUNTER — Other Ambulatory Visit (HOSPITAL_BASED_OUTPATIENT_CLINIC_OR_DEPARTMENT_OTHER): Payer: Self-pay

## 2024-06-09 MED ORDER — LOSARTAN POTASSIUM 100 MG PO TABS
100.0000 mg | ORAL_TABLET | Freq: Every day | ORAL | 0 refills | Status: AC
  Filled 2024-06-09: qty 90, 90d supply, fill #0
